# Patient Record
Sex: Female | Born: 1960 | Race: White | Hispanic: No | Smoking: Never smoker
Health system: Southern US, Community
[De-identification: ages and names within clinical notes are randomized; demographics above are authoritative.]

## PROBLEM LIST (undated history)

## (undated) DIAGNOSIS — R0602 Shortness of breath: Secondary | ICD-10-CM

## (undated) DIAGNOSIS — M7550 Bursitis of unspecified shoulder: Secondary | ICD-10-CM

## (undated) DIAGNOSIS — E785 Hyperlipidemia, unspecified: Secondary | ICD-10-CM

## (undated) DIAGNOSIS — F32A Depression, unspecified: Secondary | ICD-10-CM

## (undated) DIAGNOSIS — M069 Rheumatoid arthritis, unspecified: Secondary | ICD-10-CM

## (undated) DIAGNOSIS — R0601 Orthopnea: Secondary | ICD-10-CM

## (undated) DIAGNOSIS — G4700 Insomnia, unspecified: Secondary | ICD-10-CM

## (undated) DIAGNOSIS — R079 Chest pain, unspecified: Secondary | ICD-10-CM

## (undated) HISTORY — DX: Orthopnea: R06.01

## (undated) HISTORY — DX: Depression, unspecified: F32.A

## (undated) HISTORY — DX: Insomnia, unspecified: G47.00

## (undated) HISTORY — PX: OTHER SURGICAL HISTORY: SHX169

## (undated) HISTORY — DX: Hyperlipidemia, unspecified: E78.5

## (undated) HISTORY — PX: ABDOMINAL HYSTERECTOMY W/ PARTIAL VAGINACTOMY: SUR660

## (undated) HISTORY — DX: Rheumatoid arthritis, unspecified: M06.9

## (undated) HISTORY — DX: Shortness of breath: R06.02

## (undated) HISTORY — PX: ABDOMINAL HYSTERECTOMY: SHX81

## (undated) HISTORY — DX: Bursitis of unspecified shoulder: M75.50

## (undated) HISTORY — DX: Chest pain, unspecified: R07.9

## (undated) HISTORY — PX: ROTATOR CUFF REPAIR: SHX139

## (undated) HISTORY — PX: CHOLECYSTECTOMY: SHX55

---

## 1999-07-09 ENCOUNTER — Encounter (INDEPENDENT_AMBULATORY_CARE_PROVIDER_SITE_OTHER): Payer: Self-pay | Admitting: Specialist

## 1999-07-09 ENCOUNTER — Inpatient Hospital Stay (HOSPITAL_COMMUNITY): Admission: RE | Admit: 1999-07-09 | Discharge: 1999-07-11 | Payer: Self-pay | Admitting: Gynecology

## 2000-09-13 ENCOUNTER — Other Ambulatory Visit: Admission: RE | Admit: 2000-09-13 | Discharge: 2000-09-13 | Payer: Self-pay | Admitting: Gynecology

## 2000-12-14 ENCOUNTER — Ambulatory Visit (HOSPITAL_COMMUNITY): Admission: RE | Admit: 2000-12-14 | Discharge: 2000-12-14 | Payer: Self-pay | Admitting: *Deleted

## 2004-06-18 ENCOUNTER — Encounter: Admission: RE | Admit: 2004-06-18 | Discharge: 2004-06-18 | Payer: Self-pay | Admitting: Obstetrics and Gynecology

## 2004-12-02 ENCOUNTER — Encounter: Admission: RE | Admit: 2004-12-02 | Discharge: 2004-12-02 | Payer: Self-pay | Admitting: Obstetrics and Gynecology

## 2006-02-17 ENCOUNTER — Emergency Department (HOSPITAL_COMMUNITY): Admission: EM | Admit: 2006-02-17 | Discharge: 2006-02-17 | Payer: Self-pay | Admitting: Emergency Medicine

## 2006-08-20 ENCOUNTER — Encounter: Admission: RE | Admit: 2006-08-20 | Discharge: 2006-08-20 | Payer: Self-pay | Admitting: Allergy

## 2008-11-10 ENCOUNTER — Inpatient Hospital Stay (HOSPITAL_COMMUNITY): Admission: EM | Admit: 2008-11-10 | Discharge: 2008-11-11 | Payer: Self-pay | Admitting: Emergency Medicine

## 2008-11-30 ENCOUNTER — Encounter: Admission: RE | Admit: 2008-11-30 | Discharge: 2008-11-30 | Payer: Self-pay | Admitting: Family Medicine

## 2008-12-06 ENCOUNTER — Encounter: Admission: RE | Admit: 2008-12-06 | Discharge: 2008-12-06 | Payer: Self-pay | Admitting: Family Medicine

## 2010-05-15 LAB — BASIC METABOLIC PANEL
BUN: 13 mg/dL (ref 6–23)
BUN: 15 mg/dL (ref 6–23)
CO2: 27 mEq/L (ref 19–32)
CO2: 31 mEq/L (ref 19–32)
Calcium: 9 mg/dL (ref 8.4–10.5)
Calcium: 9 mg/dL (ref 8.4–10.5)
Chloride: 103 mEq/L (ref 96–112)
Chloride: 103 mEq/L (ref 96–112)
Creatinine, Ser: 0.7 mg/dL (ref 0.4–1.2)
Creatinine, Ser: 0.79 mg/dL (ref 0.4–1.2)
GFR calc Af Amer: >60 mL/min (ref >60)
GFR calc Af Amer: >60 mL/min (ref >60)
GFR calc non Af Amer: >60 mL/min (ref >60)
GFR calc non Af Amer: >60 mL/min (ref >60)
Glucose, Bld: 105 mg/dL — ABNORMAL HIGH (ref 70–99)
Glucose, Bld: 110 mg/dL — ABNORMAL HIGH (ref 70–99)
Potassium: 3.7 mEq/L (ref 3.5–5.1)
Potassium: 4 mEq/L (ref 3.5–5.1)
Sodium: 137 mEq/L (ref 135–145)
Sodium: 139 mEq/L (ref 135–145)

## 2010-05-15 LAB — CBC
HCT: 40.2 % (ref 36.0–46.0)
HCT: 40.5 % (ref 36.0–46.0)
Hemoglobin: 13.7 g/dL (ref 12.0–15.0)
Hemoglobin: 13.8 g/dL (ref 12.0–15.0)
MCHC: 34 g/dL (ref 30.0–36.0)
MCHC: 34 g/dL (ref 30.0–36.0)
MCV: 88.8 fL (ref 78.0–100.0)
MCV: 88.9 fL (ref 78.0–100.0)
Platelets: 284 10*3/uL (ref 150–400)
Platelets: 311 10*3/uL (ref 150–400)
RBC: 4.53 MIL/uL (ref 3.87–5.11)
RBC: 4.55 MIL/uL (ref 3.87–5.11)
RDW: 13.4 % (ref 11.5–15.5)
RDW: 13.4 % (ref 11.5–15.5)
WBC: 4.4 10*3/uL (ref 4.0–10.5)
WBC: 6.6 10*3/uL (ref 4.0–10.5)

## 2010-05-15 LAB — DIFFERENTIAL
Basophils Absolute: 0 10*3/uL (ref 0.0–0.1)
Basophils Relative: 1 % (ref 0–1)
Eosinophils Absolute: 0.1 10*3/uL (ref 0.0–0.7)
Eosinophils Relative: 2 % (ref 0–5)
Lymphocytes Relative: 32 % (ref 12–46)
Lymphs Abs: 2.1 10*3/uL (ref 0.7–4.0)
Monocytes Absolute: 0.5 10*3/uL (ref 0.1–1.0)
Monocytes Relative: 8 % (ref 3–12)
Neutro Abs: 3.8 10*3/uL (ref 1.7–7.7)
Neutrophils Relative %: 58 % (ref 43–77)

## 2010-05-15 LAB — CARDIAC PANEL(CRET KIN+CKTOT+MB+TROPI)
CK, MB: 1.6 ng/mL (ref 0.3–4.0)
CK, MB: 2.1 ng/mL (ref 0.3–4.0)
CK, MB: 2.2 ng/mL (ref 0.3–4.0)
Relative Index: 1.6 (ref 0.0–2.5)
Relative Index: 2.1 (ref 0.0–2.5)
Relative Index: INVALID (ref 0.0–2.5)
Total CK: 101 U/L (ref 7–177)
Total CK: 102 U/L (ref 7–177)
Total CK: 98 U/L (ref 7–177)
Troponin I: 0.01 ng/mL (ref 0.00–0.06)
Troponin I: 0.01 ng/mL (ref 0.00–0.06)
Troponin I: 0.01 ng/mL (ref 0.00–0.06)

## 2010-05-15 LAB — POCT CARDIAC MARKERS
CKMB, poc: 1.5 ng/mL (ref 1.0–8.0)
Myoglobin, poc: 69.2 ng/mL (ref 12–200)
Troponin i, poc: <0.05 ng/mL (ref 0.00–0.09)

## 2010-05-15 LAB — LIPID PANEL
Cholesterol: 324 mg/dL — ABNORMAL HIGH (ref 0–200)
HDL: 46 mg/dL (ref >39)
LDL Cholesterol: 207 mg/dL — ABNORMAL HIGH (ref 0–99)
Total CHOL/HDL Ratio: 7 RATIO
Triglycerides: 356 mg/dL — ABNORMAL HIGH (ref <150)
VLDL: 71 mg/dL — ABNORMAL HIGH (ref 0–40)

## 2010-05-15 LAB — HEMOGLOBIN A1C
Hgb A1c MFr Bld: 5.5 % (ref 4.6–6.1)
Mean Plasma Glucose: 111 mg/dL

## 2010-05-15 LAB — TSH: TSH: 2.132 u[IU]/mL (ref 0.350–4.500)

## 2010-05-15 LAB — D-DIMER, QUANTITATIVE: D-Dimer, Quant: 1.87 ug/mL-FEU — ABNORMAL HIGH (ref 0.00–0.48)

## 2010-06-27 NOTE — Op Note (Signed)
[facility]  Patient:    Virginia Hayes, Virginia Hayes                  MRN: [id number] Proc. Date: 07/09/99 Adm. Date:  [id number] Attending:  Katrina Stack CC:         Gretta Cool, M.D. (office x 2)                           Operative Report  PREOPERATIVE DIAGNOSES: 1. Severe and incapacitating cyclic pelvic pain secondary to endometriosis. 2. Pelvic organ prolapse with stress urinary incontinence, cystocele,    rectocele and enterocele. 3. History of laparoscopy and laser vaporization of stage III endometriosis    in 1989.  POSTOPERATIVE DIAGNOSES: 1. Severe and incapacitating cyclic pelvic pain secondary to endometriosis. 2. Pelvic organ prolapse with stress urinary incontinence, cystocele,    rectocele and enterocele. 3. History of laparoscopy and laser vaporization of stage III endometriosis    in 1989.  PROCEDURE:  Total abdominal hysterectomy, lysis of extensive adhesions, ureterolysis right, paravaginal repair plus Burch procedure, posterior and enterocele repairs.  ANESTHESIA:  General  SURGEON:  Gretta Cool, M.D.  ASSISTANT:  Raynald Kemp, M.D.  DESCRIPTION OF PROCEDURE:  Under excellent general anesthesia endotracheal with the patient prepped and draped in Allen stirrups and 30 cc Foley in her bladder, a Pfannenstiel incision was made and extended through the fascia. Rectus muscle was then separated in the midline and the peritoneum entered. The abdominal cavity was then examined.  The upper abdomen was entirely normal.  The appendix was visualized and not removed.  The cecum was quite large and the large bowel redundant.  At this point examination of the pelvis revealed bilateral severe ovarian adhesions to the lateral pelvic wall over the ureters bilaterally.  The adhesions were exceedingly dense, right greater than left.  Note there was extensive bleeding from the time of the first skin incision throughout the procedure  with the patients history of having taken ibuprofen one day preoperatively against advise.  At this point, the round ligaments were sutured, ligated and then transected.  The anterior leaf of the broad ligament was then opened with excision of the endometriosis involving the anterior leaf of the broad ligament.  The infundibulopelvic vessels were then skeletonized on each side and then clamped, cut, sutured, and transected. The pedicles were doubly ligated with 0 Vicryl.  On the right, dense adhesion of the ovary was encountered.  Ureterolysis was undertaken so as to mobilize the ureter and prevent injury.  Endometriosis with a portion of the ovary adherent to it was then excised along with the peritoneum involved by the endometriosis.  The uterine vessels were then skeletonized on each side, clamped, cut, sutured, and tied with 0 Vicryl.  A branch of the right uterine vessel was transected and retracted toward the pelvic wall.  The vessel was identified and individually ligated.  Cardinal and uterosacral ligaments were then progressively clamped, cut, sutured, and tied with 0 Vicryl.  The cervix was then excised and the vaginal closed with a running suture of 0 Vicryl. The uterosacral were then identified as far posteriorly as possible secured to the vaginal cuff both anteriorly and posteriorly with interrupted suture of 0 Ethibond so as to provide level 1 support using the uterosacral cardinal complex.  The cardinal and uterosacral ligaments were then also plicated to the vaginal cuff with interrupted sutures of 0 Ethibond.  At this point,  all the pedicles were dry.  The pelvis is irrigated with lactated Ringers to remove debris.  Approximately 1000 cc of blood loss was encountered with the management of the branch of the uterine vessel.  The pelvic floor was then reperitonealized with a running suture of 2-0 Monocryl.  At this point, the packs were removed and the sponge and lap counts  were correct.  The abdominal peritoneum was closed with a running suture of 0 Monocryl.  At this point, attention was turned to the paravaginal repair and Burch.  The retropubic space was dissected and the fatty tissue removed from the endopelvic fascia.  The white line fascia of the pelvis was then identified and the landmarks identified for the paravaginal repair.  A series of approximately six sutures on the right and five on the left were placed so as to repair the paravaginal defect.  The Burch procedure was then also used to secure the vesicle neck into high intra-abdominal position.  Sutures were placed as far lateral as possible and secured to Coopers ligament.  Three sutures were required on the right and two on the left using 0 Ethibond.  At this point, all of the retropubic space was dry.  The space was irrigated with lactated Ringers. The rectus muscles were then plicated in the midline with a running suture of 0 Monocryl.  The fascia was then approximated from each angle to the midline with a running suture of 0 Vicryl.  Subcutaneous tissues approximated with interrupted sutures of 3-0 Vicryl and the skin closed with skin staples and Steri-Strips.  At this point, the patient was repositioned for the vaginal portion of the procedure.  The posterior and enterocele repair was begun by incision of the posterior vaginal mucosa from the introitus to the apex of the vaginal cuff.  The cut edges were then grasped with Allis clamps.  The mucosa was then dissected from the underlying perirectal fascia.  The uterosacral ligaments were then, once again, plicated as far posteriorly as possible so as to further secure the level 1 support.  Sutures of 0 Vicryl were then used to plicate the transverse fascial separation from the apex of the vaginal cuff to the perirectal fascia. The enterocele was thus entirely plicated to create a complete envelope of endopelvic fascia.  The perirectal  fascia was then further plicated in the midline from the apex to the vaginal cuff to the introitus.  The mucosa was then trimmed and the upper layers of mucosa and upper layers of endopelvic  fascia were then approximated with a running suture of 2-0 Vicryl. The perineal body musculature was then repaired with interrupted sutures of 2-0 Vicryl.  The operators fingers were placed in the rectum were placed in the rectum for suture placement to prevent encroachment on the rectal lumen. A Bonnano suprapubic cystocath was then placed after approximately 400 cc distension of the bladder.  The Bonnano suprapubic catheter was secured with interrupted sutures of 0 Ethibond.  At the end of the procedure, sponge and lap counts were correct.  There were no complications.  The patient returned to the recovery room in excellent condition. DD:  07/09/99 TD:  07/10/99 Job: [id number]

## 2010-06-27 NOTE — H&P (Signed)
[facility]  Patient:    Virginia Hayes, Virginia Hayes                  MRN: [id number] Adm. Date:  [id number] Attending:  Katrina Stack                         History and Physical  CHIEF COMPLAINT: 1. Endometriosis with increasingly severe and incapacitating cyclic pelvic    pain. 2. Urinary incontinence.  HISTORY OF PRESENT ILLNESS:  A 50 year old white married gravida 3, para 3 with known severe grade 3 endometriosis involving both ovaries treated initially in 1989 with successful fertility subsequently. Initially treated by laparoscopy, laser vaporization and excision of endometriosis and uterosacral nerve oblation. She had 2 vaginal deliveries without complication. Then with her third delivery by nurse midwives had a posterior presentation with prolonged pushing and no episiotomy. She subsequently developed significant urinary incontinence with coughing, sneezing, laughing, straining, and lifting that has increased since that time. She also has developed dyspareunia that was not present previously. Her symptoms have now progressed to the point of incapacitation for several days during her cycle in the premenstrual period. She requires narcotic medication for control of her pain. She also has had progressively worsening incontinence. She has had bladder evaluation with voiding diary and has voided volumes as high as 250 cc. She has incontinence demonstrated in supine position with her bladder full corrected by elevation of the vesical neck. She also has positive Q tip test with rotation of approximately 40 degrees. She has a right paravaginal defect greater than left. No evidence of central fascial weakness or defect. She has a quite large enterocele with transverse detachment of the perirectal fascia and levator diastasis. 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 She is now admitted for hysterectomy and possible  bilateral salpingo-oophorectomy and for paravaginal plus Burch posterior and enterocele repairs.  I have discussed in detail the risks and benefits of the procedure, possible need for bilateral salpingo-oophorectomy considering that she previously had involvement of both ovaries with endometrioma formation. She understands the risks of bowel, bladder, adjacent organ injury, hemorrhage, thrombosis, infection, and possible need for long-term hormone replacement therapy of both estrogen and androgen. She also understands the need for avoidance of straining in correction of her chronic constipation to prevent recurrence of pelvic support difficulties.  PAST MEDICAL HISTORY:  Usual childhood diseases without sequelae.  MEDICAL ILLNESSES:  Endometriosis as above. History of irritable bowel syndrome with constipation alternating with diarrhea. The patient also has hypercholesterolemia with elevated LDL and slight increasing calculated coronary risk.  PAST SURGICAL HISTORY:  Laparoscopy, laser vaporization of endometriosis, excision of laser uterosacral nerve oblation 1989. Knee surgery in 1994.  OTHER HOSPITALIZATIONS:  Vaginal delivery x 3.  FAMILY HISTORY:  Father died at age 53 of massive MI. Mother had enormous endometriomas with intraperitoneal rupture. Also had pelvic support problems requiring surgery.  SOCIAL HISTORY:  Married with 3 children living at home. She is a Engineer, civil (consulting) at [employer] and plans to return for PA program to the [place] [employer] [place].  REVIEW OF SYSTEMS:  HEENT:  Denies symptoms.  CARDIAC/RESPIRATORY:  Denies asthma, cough, bronchitis, shortness of breath. GI/GU:  Denies frequency, urgency, dysuria, change in bowel habits other than known long-term IBS.  PHYSICAL EXAMINATION:  GENERAL:  Well-developed, tall, white female.  HEENT:  PERRLA. Fundi benign. Oropharynx clear.  NECK:  Supple without masses or thyroid enlargement.  CHEST:  Asymmetry of  the chest wall with slight pigeon breast deformity of the sternum. Chest is clear P to A.  BREASTS:  Soft without mass, nodes, or nipple discharge.  HEART:  Regular rhythm without murmur or cardiac enlargement.  ABDOMEN:  Soft, scaphoid without mass or organomegaly.  PELVIC:  External genitalia normal female. Vagina clean rugae. The introitus is somewhat gaping. There is a right paravaginal defect with straining. No evidence of central or transverse fascial defect at the anterior vaginal wall. She has rotational descent of the vesical neck as above. There is extremely poor posterior pelvic support with a grade 3 enterocele, levator diastasis and detachment of the perineal body with perineal descent syndrome with straining. She also has grade 2 uterine descensus. The cervix is large and parous. The uterus is also enlarged to approximately 6 weeks size, adnexa tender to palpation and somewhat fixed. Rectovaginal exam confirms.  EXTREMITIES:  Negative.  NEUROLOGIC:  Physiologic.  IMPRESSION: 1. Endometriosis with incapacitating cyclic pelvic pain and previous history    of laser vaporization of stage 3 endometriosis. 2. Stress urinary incontinence increasingly severe. 3. Posterior pelvic support problems with enterocele, rectocele. 4. Hypercholesterolemia with strong family history of early coronary death in    her father. DD:  1999/08/02 TD:  1999-08-02 Job: [id number]

## 2010-08-22 ENCOUNTER — Other Ambulatory Visit: Payer: Self-pay

## 2010-08-22 ENCOUNTER — Other Ambulatory Visit (HOSPITAL_COMMUNITY): Payer: Self-pay | Admitting: Family Medicine

## 2010-08-22 ENCOUNTER — Inpatient Hospital Stay (HOSPITAL_COMMUNITY): Admission: RE | Admit: 2010-08-22 | Payer: Self-pay | Source: Ambulatory Visit

## 2010-08-22 ENCOUNTER — Other Ambulatory Visit: Payer: Self-pay | Admitting: Family Medicine

## 2010-08-22 DIAGNOSIS — H543 Unqualified visual loss, both eyes: Secondary | ICD-10-CM

## 2010-08-22 DIAGNOSIS — R52 Pain, unspecified: Secondary | ICD-10-CM

## 2010-08-22 DIAGNOSIS — R609 Edema, unspecified: Secondary | ICD-10-CM

## 2010-08-22 DIAGNOSIS — R42 Dizziness and giddiness: Secondary | ICD-10-CM

## 2010-09-05 ENCOUNTER — Other Ambulatory Visit: Payer: Self-pay | Admitting: Family Medicine

## 2010-09-05 ENCOUNTER — Ambulatory Visit
Admission: RE | Admit: 2010-09-05 | Discharge: 2010-09-05 | Disposition: A | Discharge status: Home / Self Care | Payer: 59 | Source: Ambulatory Visit | Attending: Family Medicine | Admitting: Family Medicine

## 2010-09-05 DIAGNOSIS — M79669 Pain in unspecified lower leg: Secondary | ICD-10-CM

## 2011-12-06 ENCOUNTER — Emergency Department (HOSPITAL_COMMUNITY)
Admission: EM | Admit: 2011-12-06 | Discharge: 2011-12-06 | Disposition: A | Discharge status: Home / Self Care | Payer: 59 | Attending: Emergency Medicine | Admitting: Emergency Medicine

## 2011-12-06 ENCOUNTER — Emergency Department (HOSPITAL_COMMUNITY): Payer: 59

## 2011-12-06 ENCOUNTER — Encounter (HOSPITAL_COMMUNITY): Payer: Self-pay | Admitting: Physical Medicine and Rehabilitation

## 2011-12-06 DIAGNOSIS — S0100XA Unspecified open wound of scalp, initial encounter: Secondary | ICD-10-CM | POA: Insufficient documentation

## 2011-12-06 DIAGNOSIS — Z79899 Other long term (current) drug therapy: Secondary | ICD-10-CM | POA: Insufficient documentation

## 2011-12-06 DIAGNOSIS — Z8739 Personal history of other diseases of the musculoskeletal system and connective tissue: Secondary | ICD-10-CM | POA: Insufficient documentation

## 2011-12-06 DIAGNOSIS — Y929 Unspecified place or not applicable: Secondary | ICD-10-CM | POA: Insufficient documentation

## 2011-12-06 DIAGNOSIS — S60222A Contusion of left hand, initial encounter: Secondary | ICD-10-CM

## 2011-12-06 DIAGNOSIS — S0101XA Laceration without foreign body of scalp, initial encounter: Secondary | ICD-10-CM

## 2011-12-06 DIAGNOSIS — S60229A Contusion of unspecified hand, initial encounter: Secondary | ICD-10-CM | POA: Insufficient documentation

## 2011-12-06 DIAGNOSIS — S301XXA Contusion of abdominal wall, initial encounter: Secondary | ICD-10-CM | POA: Insufficient documentation

## 2011-12-06 DIAGNOSIS — Y9352 Activity, horseback riding: Secondary | ICD-10-CM | POA: Insufficient documentation

## 2011-12-06 DIAGNOSIS — W19XXXA Unspecified fall, initial encounter: Secondary | ICD-10-CM

## 2011-12-06 DIAGNOSIS — Z8709 Personal history of other diseases of the respiratory system: Secondary | ICD-10-CM | POA: Insufficient documentation

## 2011-12-06 DIAGNOSIS — E785 Hyperlipidemia, unspecified: Secondary | ICD-10-CM | POA: Insufficient documentation

## 2011-12-06 LAB — CBC WITH DIFFERENTIAL/PLATELET
Basophils Relative: 0 % (ref 0–1)
Eosinophils Absolute: 0.2 10*3/uL (ref 0.0–0.7)
HCT: 39.5 % (ref 36.0–46.0)
Hemoglobin: 13.6 g/dL (ref 12.0–15.0)
Lymphs Abs: 2.2 10*3/uL (ref 0.7–4.0)
MCH: 29.5 pg (ref 26.0–34.0)
MCHC: 34.4 g/dL (ref 30.0–36.0)
MCV: 85.7 fL (ref 78.0–100.0)
Monocytes Absolute: 0.5 10*3/uL (ref 0.1–1.0)
Monocytes Relative: 5 % (ref 3–12)
Neutrophils Relative %: 71 % (ref 43–77)
RBC: 4.61 MIL/uL (ref 3.87–5.11)

## 2011-12-06 LAB — POCT I-STAT, CHEM 8
BUN: 14 mg/dL (ref 6–23)
Calcium, Ion: 1.23 mmol/L (ref 1.12–1.23)
Chloride: 106 mEq/L (ref 96–112)
Creatinine, Ser: 1 mg/dL (ref 0.50–1.10)

## 2011-12-06 LAB — SAMPLE TO BLOOD BANK

## 2011-12-06 MED ORDER — IOHEXOL 300 MG/ML  SOLN
100.0000 mL | Freq: Once | INTRAMUSCULAR | Status: AC | PRN
  Administered 2011-12-06: 100 mL via INTRAVENOUS

## 2011-12-06 MED ORDER — ONDANSETRON HCL 4 MG/2ML IJ SOLN: 4.0000 mg | Freq: Once | INTRAMUSCULAR | Status: DC

## 2011-12-06 MED ORDER — MORPHINE SULFATE 4 MG/ML IJ SOLN: 4.0000 mg | Freq: Once | INTRAMUSCULAR | Status: DC

## 2011-12-06 MED ORDER — HYDROCODONE-ACETAMINOPHEN 5-325 MG PO TABS: 2.0000 | ORAL_TABLET | ORAL | Status: DC | PRN

## 2011-12-06 MED ORDER — LIDOCAINE HCL 2 % IJ SOLN
10.0000 mL | Freq: Once | INTRAMUSCULAR | Status: AC
  Administered 2011-12-06: 200 mg
  Filled 2011-12-06: qty 20

## 2011-12-06 NOTE — ED Provider Notes (Signed)
History     CSN: [id number]  Arrival date & time 12/06/11  1739   First MD Initiated Contact with Patient 12/06/11 1742      Chief Complaint  Patient presents with  . Fall    (Consider location/radiation/quality/duration/timing/severity/associated sxs/prior treatment) Patient is a 51 y.o. female presenting with fall. The history is provided by the patient. No language interpreter was used.  Fall The accident occurred 1 to 2 hours ago. Incident: Pt was riding a horse.  The horse was spooked by dogs and threw her.  The horse stepped on the back of her head and then on her left hand.  Her son's girlfriend took the horse's reins and led the horse away.  Pt was ambulatory to get on a 4 wheeler.  She fell from a height of 3 to 5 ft. She landed on dirt. The volume of blood lost was minimal. The point of impact was the head (Left hand). The pain is present in the head (Left hand). The pain is at a severity of 9/10. The pain is moderate. She was ambulatory at the scene. There was entrapment (Her left hand was stepped on by the horse, which then just stood there, holding pt down.) after the fall. There was no drug use involved in the accident. There was no alcohol use involved in the accident. Pertinent negatives include no loss of consciousness. Exacerbated by: Nothing. Treatment on scene includes a c-collar and a backboard. She has tried immobilization for the symptoms. The treatment provided no (She is up to date on tetanus immunization.) relief.    Past Medical History  Diagnosis Date  . Chest pain   . Dyslipidemia   . Chest pain   . SOB (shortness of breath)   . Orthopnea   . Hyperlipidemia   . Shoulder bursitis     Past Surgical History  Procedure Date  . Abdominal hysterectomy w/ partial vaginactomy   . Left knee      Family History  Problem Relation Age of Onset  . Heart disease Father     History  Substance Use Topics  . Smoking status: Never Smoker   . Smokeless tobacco:  Not on file  . Alcohol Use: No    OB History    Grav Para Term Preterm Abortions TAB SAB Ect Mult Living                  Review of Systems  Constitutional: Negative.   HENT:       Laceration of right occipital scalp   Eyes: Negative.   Respiratory: Negative.   Cardiovascular: Negative.   Gastrointestinal: Negative.   Genitourinary: Negative.   Musculoskeletal:       Horse stepped on left hand.  Neurological: Negative.  Negative for loss of consciousness.  Psychiatric/Behavioral: Negative.     Allergies  Review of patient's allergies indicates no known allergies.  Home Medications   Current Outpatient Rx  Name Route Sig Dispense Refill  . ATORVASTATIN CALCIUM 20 MG PO TABS Oral Take 20 mg by mouth daily.    Marland Kitchen DIPHENHYDRAMINE-APAP (SLEEP) 25-500 MG PO TABS Oral Take 1 tablet by mouth at bedtime as needed.    Marland Kitchen ESTRADIOL-NORETHINDRONE ACET 0.05-0.14 MG/DAY TD PTTW Transdermal Place 1 patch onto the skin 2 (two) times a week.    . ALLEGRA-D 24 HOUR PO Oral Take by mouth.      BP 144/96  Pulse 103  Temp 98.1 F (36.7 C) (Oral)  Resp 18  SpO2 100%  Physical Exam  Nursing note and vitals reviewed. Constitutional: She appears well-developed and well-nourished.       In mild-moderate distress with pain in occipital region, and in left hand.  HENT:       2 cm laceration on right occipital scalp.  No bony deformity of the skull.  No FB in wound.  Eyes: Conjunctivae normal and EOM are normal. Pupils are equal, round, and reactive to light.  Neck: Normal range of motion. Neck supple.       Cervical collar removed by me.  Cardiovascular: Normal rate and regular rhythm.   Pulmonary/Chest: Effort normal and breath sounds normal.  Abdominal: Soft. Bowel sounds are normal.  Musculoskeletal:       She has contusion and swelling on the dorsum of the left hand overlying the proximal second and third metacarpals.  She has intact sensation and tendon function in the left hand.   The skin is intact.  Skin: Skin is warm and dry.  Psychiatric: She has a normal mood and affect. Her behavior is normal.    ED Course  LACERATION REPAIR Date/Time: 12/06/2011 6:16 PM Performed by: Osvaldo Human Authorized by: Osvaldo Human Consent: Verbal consent obtained. Written consent not obtained. Risks and benefits: risks, benefits and alternatives were discussed Consent given by: patient Patient understanding: patient states understanding of the procedure being performed Patient consent: the patient's understanding of the procedure matches consent given Site marked: the operative site was not marked Patient identity confirmed: verbally with patient Time out: Immediately prior to procedure a "time out" was called to verify the correct patient, procedure, equipment, support staff and site/side marked as required. Body area: head/neck Location details: scalp Laceration length: 3 cm Foreign bodies: no foreign bodies Tendon involvement: none Nerve involvement: none Vascular damage: no Anesthesia: local infiltration Local anesthetic: lidocaine 2% without epinephrine Patient sedated: no Preparation: Patient was prepped and draped in the usual sterile fashion. Irrigation solution: saline Irrigation method: tap Amount of cleaning: standard Debridement: none Degree of undermining: none Skin closure: staples Number of sutures: 9 Technique: simple Approximation: loose Approximation difficulty: simple Dressing: Wound left exposed. Patient tolerance: Patient tolerated the procedure well with no immediate complications.   (including critical care time)   7:50 PM CT of head and neck negative.  X-ray of left hand negative.  She is complaining of RUQ pain now.  Will order lab tests, CT of abdomen/pelvis with IV contrast.  9:23 PM CT abdomen/pelvis showed contusion of subq tissue in RUQ, no visceral injury.  Released. Rx hydrocodone-acetaminophen q4h prn pain.  Staples  out in 7-10 days.  1. Fall   2. Laceration of scalp   3. Contusion of left hand   4. Contusion of abdominal wall         Carleene Cooper III, MD 12/06/11 2127

## 2011-12-06 NOTE — ED Notes (Signed)
Pt transported to CT scan and x-ray. Family remains in exam room.

## 2011-12-06 NOTE — ED Notes (Addendum)
Pt transported to CT. Pt stating she does not want the pain medication at this time.

## 2011-12-06 NOTE — ED Notes (Signed)
Dr. Ignacia Palma at the bedside to perform suture care to scalp laceration. Pt tolerating without difficulty.

## 2011-12-06 NOTE — ED Notes (Signed)
Pt presents to department for evaluation of fall off horse. States she was bucked off horse while riding. Horse kicked her in head and stepped on L hand. Upon arrival 2cm laceration noted to R occipital scalp, bleeding controlled. Bruising/swelling noted to L hand. Pt can wiggle digits, but is unable to make complete fist. 3/10 pain at the time. She is conscious alert and oriented x4. c-collar and LSB removed by Dr. Ignacia Palma. Pt is conscious alert and oriented x4. Able to move all extremities. She is up to date on tetanus.

## 2011-12-06 NOTE — ED Notes (Signed)
Pt presents to department from [facility] for evaluation of fall from horse. States she was bucked off horse, kicked in head and trampeled on L hand. Laceration noted to R occipital scalp, bleeding controlled. Pt is conscious alert and oriented x4. CMS intact. LSB and c-collar in place per EMS.

## 2011-12-06 NOTE — ED Notes (Signed)
Pt returned from CT °

## 2012-03-28 ENCOUNTER — Other Ambulatory Visit: Payer: Self-pay | Admitting: Specialist

## 2012-03-28 DIAGNOSIS — N63 Unspecified lump in unspecified breast: Secondary | ICD-10-CM

## 2012-04-05 ENCOUNTER — Other Ambulatory Visit: Payer: Self-pay | Admitting: Specialist

## 2012-04-05 DIAGNOSIS — N644 Mastodynia: Secondary | ICD-10-CM

## 2012-04-06 ENCOUNTER — Other Ambulatory Visit: Payer: Self-pay | Admitting: Specialist

## 2012-04-06 ENCOUNTER — Ambulatory Visit
Admission: RE | Admit: 2012-04-06 | Discharge: 2012-04-06 | Disposition: A | Discharge status: Home / Self Care | Payer: 59 | Source: Ambulatory Visit | Attending: Specialist | Admitting: Specialist

## 2012-04-06 DIAGNOSIS — N63 Unspecified lump in unspecified breast: Secondary | ICD-10-CM

## 2012-04-14 ENCOUNTER — Encounter: Payer: Self-pay | Admitting: *Deleted

## 2012-05-31 ENCOUNTER — Other Ambulatory Visit: Payer: Self-pay | Admitting: *Deleted

## 2012-06-02 ENCOUNTER — Other Ambulatory Visit: Payer: Self-pay | Admitting: *Deleted

## 2012-06-02 DIAGNOSIS — M2550 Pain in unspecified joint: Secondary | ICD-10-CM

## 2013-04-24 ENCOUNTER — Other Ambulatory Visit: Payer: Self-pay

## 2013-04-24 DIAGNOSIS — Z1231 Encounter for screening mammogram for malignant neoplasm of breast: Secondary | ICD-10-CM

## 2013-05-08 ENCOUNTER — Inpatient Hospital Stay: Admission: RE | Admit: 2013-05-08 | Payer: 59 | Source: Ambulatory Visit

## 2016-03-02 ENCOUNTER — Emergency Department (HOSPITAL_BASED_OUTPATIENT_CLINIC_OR_DEPARTMENT_OTHER)
Admission: EM | Admit: 2016-03-02 | Discharge: 2016-03-03 | Disposition: A | Discharge status: Home / Self Care | Payer: BLUE CROSS/BLUE SHIELD | Attending: Emergency Medicine | Admitting: Emergency Medicine

## 2016-03-02 ENCOUNTER — Emergency Department (HOSPITAL_BASED_OUTPATIENT_CLINIC_OR_DEPARTMENT_OTHER): Payer: BLUE CROSS/BLUE SHIELD

## 2016-03-02 ENCOUNTER — Encounter (HOSPITAL_BASED_OUTPATIENT_CLINIC_OR_DEPARTMENT_OTHER): Payer: Self-pay | Admitting: *Deleted

## 2016-03-02 DIAGNOSIS — Y999 Unspecified external cause status: Secondary | ICD-10-CM | POA: Diagnosis not present

## 2016-03-02 DIAGNOSIS — S8011XA Contusion of right lower leg, initial encounter: Secondary | ICD-10-CM

## 2016-03-02 DIAGNOSIS — Y939 Activity, unspecified: Secondary | ICD-10-CM | POA: Diagnosis not present

## 2016-03-02 DIAGNOSIS — S8991XA Unspecified injury of right lower leg, initial encounter: Secondary | ICD-10-CM | POA: Diagnosis present

## 2016-03-02 DIAGNOSIS — W208XXA Other cause of strike by thrown, projected or falling object, initial encounter: Secondary | ICD-10-CM | POA: Insufficient documentation

## 2016-03-02 DIAGNOSIS — Y929 Unspecified place or not applicable: Secondary | ICD-10-CM | POA: Diagnosis not present

## 2016-03-02 MED ORDER — HYDROCODONE-ACETAMINOPHEN 5-325 MG PO TABS
2.0000 | ORAL_TABLET | Freq: Once | ORAL | Status: AC
  Administered 2016-03-02: 2 via ORAL
  Filled 2016-03-02: qty 2

## 2016-03-02 MED ORDER — IBUPROFEN 800 MG PO TABS
800.0000 mg | ORAL_TABLET | Freq: Once | ORAL | Status: AC
  Administered 2016-03-02: 800 mg via ORAL
  Filled 2016-03-02: qty 1

## 2016-03-02 NOTE — ED Provider Notes (Signed)
By signing my name below, I, [name], attest that this documentation has been prepared under the direction and in the presence of Sarrinah Gardin N Merrissa Giacobbe, DO. Electronically signed, [name], ED Scribe. 03/02/16. 11:53 PM.  TIME SEEN: 11:20 PM  CHIEF COMPLAINT: RLE injury.  HPI:  HPI Comments: Virginia Hayes is a 56 y.o. female who presents to the Emergency Department s/p an injury to her right lower extremity that occurred at approximately 1700 this evening. Pt was cleaning a carpet cleaner when she was flushing it with fluid and states that the cleaner became much heavier which caused her to drop it on her right lower extremity. She has noted bruising to right lower extremity to medial aspect of her calf. Pt also has significant swelling in the area but pt states that it has improved since arriving to the ED. Shortly after, her toes started to have a tingling sensation. Pt reports a tightness to her medial right calf when extending her right foot. She frequently takes Advil for her left knee pain and has taken this tonight with no relief of pain. She is followed by Dr. Jerl Santosalldorf with [facility] for previous left knee pain.   ROS: See HPI Constitutional: no fever  Eyes: no drainage  ENT: no runny nose   Cardiovascular:  no chest pain  Resp: no SOB  GI: no vomiting GU: no dysuria Integumentary: no rash  Allergy: no hives  Musculoskeletal: no leg swelling  Neurological: no slurred speech ROS otherwise negative  PAST MEDICAL HISTORY/PAST SURGICAL HISTORY:  Past Medical History:  Diagnosis Date  . Chest pain   . Chest pain   . Dyslipidemia   . Hyperlipidemia   . Orthopnea   . Shoulder bursitis   . SOB (shortness of breath)     MEDICATIONS:  Prior to Admission medications   Medication Sig Start Date End Date Taking? Authorizing Provider  diphenhydrAMINE (BENADRYL) 25 mg capsule Take 25 mg by mouth at bedtime as needed.    Historical Provider, MD  HYDROcodone-acetaminophen  (NORCO/VICODIN) 5-325 MG per tablet Take 2 tablets by mouth every 4 (four) hours as needed for pain. 12/06/11   Carleene CooperAlan Davidson, MD  rosuvastatin (CRESTOR) 5 MG tablet Take 5 mg by mouth daily.    Historical Provider, MD  thyroid (ARMOUR) 120 MG tablet Take 120 mg by mouth daily.    Historical Provider, MD  zolpidem (AMBIEN) 10 MG tablet Take 10 mg by mouth at bedtime.    Historical Provider, MD    ALLERGIES:  No Known Allergies  SOCIAL HISTORY:  Social History  Substance Use Topics  . Smoking status: Never Smoker  . Smokeless tobacco: Not on file  . Alcohol use No    FAMILY HISTORY: Family History  Problem Relation Age of Onset  . Heart disease Father     EXAM: BP (!) 178/112 (BP Location: Left Arm)   Pulse 116   Temp 98.6 F (37 C) (Oral)   Resp 18   Ht 5\' 10"  (1.778 m)   Wt 195 lb (88.5 kg)   SpO2 96%   BMI 27.98 kg/m  CONSTITUTIONAL: Alert and oriented and responds appropriately to questions. Well-appearing; well-nourished; GCS 15 HEAD: Normocephalic; atraumatic EYES: Conjunctivae clear, PERRL, EOMI ENT: normal nose; no rhinorrhea; moist mucous membranes; pharynx without lesions noted; no dental injury; no septal hematoma NECK: Supple, no meningismus, no LAD; no midline spinal tenderness, step-off or deformity; trachea midline CARD: Regular and tachycardic; S1 and S2 appreciated; no murmurs, no clicks,  no rubs, no gallops RESP: Normal chest excursion without splinting or tachypnea; breath sounds clear and equal bilaterally; no wheezes, no rhonchi, no rales; no hypoxia or respiratory distress CHEST:  chest wall stable, no crepitus or ecchymosis or deformity, nontender to palpation; no flail chest ABD/GI: Normal bowel sounds; non-distended; soft, non-tender, no rebound, no guarding; no ecchymosis or other lesions noted PELVIS:  stable, nontender to palpation BACK:  The back appears normal and is non-tender to palpation, there is no CVA tenderness; no midline spinal  tenderness, step-off or deformity EXT:  Ecchymosis and contusion to right medial calf with associated swelling, tenderness, no bony deformity, 2+ dp pulse. Tingling in all 5 toes of the right foot. Otherwise normal ROM in all joints; otherwise extremitites non-tender to palpation; no edema; normal capillary refill; no cyanosis, no bony tenderness or bony deformity of patient's extremities, no joint effusion, compartments are soft, extremities are warm and well-perfused, or lacerations.  SKIN: Normal color for age and race; warm NEURO: Moves all extremities equally, sensation to light touch intact diffusely, cranial nerves II through XII intact PSYCH: The patient's mood and manner are appropriate. Grooming and personal hygiene are appropriate.  MEDICAL DECISION MAKING: Patient here with contusion to the right leg. No fracture or dislocation noted. Compartments are soft but she does report tingling in these toes and pain with flexion and extension of the foot. We will elevate her foot, apply ice and provide with pain medication and reassess.  ED PROGRESS: Patient's numbness has completely resolved and her pain has improved as well with her foot being elevated. Vital signs have also improved somewhat. Compartments are still soft. I do not feel she needs emergent orthopedic workup and have recommended she monitor this closely for any signs of compartment syndrome. 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 Have recommended alternating Tylenol and Motrin for pain. Have offered her stronger pain medication from home which she has declined. Recommended rest, elevation and ice. Offered work note which she also declines. Offered crutches which she is declines. No other injury on exam. Still has strong pulses with a warm and well-perfused foot and now normal sensation throughout the foot. Able to move her toes, ankle without difficulty or significant pain.   At this time, I do not feel there is any  life-threatening condition present. I have reviewed and discussed all results (EKG, imaging, lab, urine as appropriate) and exam findings with patient/family. I have reviewed nursing notes and appropriate previous records.  I feel the patient is safe to be discharged home without further emergent workup and can continue workup as an outpatient as needed. Discussed usual and customary return precautions. Patient/family verbalize understanding and are comfortable with this plan.  Outpatient follow-up has been provided. All questions have been answered.  I personally performed the services described in this documentation, which was scribed in my presence. The recorded information has been reviewed and is accurate.     Layla Maw Demba Nigh, DO 03/03/16 [phone]

## 2016-03-02 NOTE — ED Triage Notes (Signed)
Pt c/o right leg injury x 1 day

## 2016-03-03 NOTE — Discharge Instructions (Signed)
You may alternate between Tylenol 1000 mg every 6 hours as needed for pain and ibuprofen 800 mg every 8 hours as needed for pain. °

## 2016-03-03 NOTE — ED Notes (Signed)
Pt states her toes feel a little better, and she can move them more

## 2016-03-03 NOTE — ED Notes (Signed)
Pt verbalizes understanding of d/c instructions and denies any further needs at this time. 

## 2016-07-27 ENCOUNTER — Other Ambulatory Visit: Payer: Self-pay | Admitting: Family Medicine

## 2016-07-27 DIAGNOSIS — Z1231 Encounter for screening mammogram for malignant neoplasm of breast: Secondary | ICD-10-CM

## 2016-08-04 ENCOUNTER — Ambulatory Visit
Admission: RE | Admit: 2016-08-04 | Discharge: 2016-08-04 | Disposition: A | Discharge status: Home / Self Care | Payer: BLUE CROSS/BLUE SHIELD | Source: Ambulatory Visit | Attending: Family Medicine | Admitting: Family Medicine

## 2016-08-04 DIAGNOSIS — Z1231 Encounter for screening mammogram for malignant neoplasm of breast: Secondary | ICD-10-CM

## 2016-11-17 ENCOUNTER — Ambulatory Visit
Admission: RE | Admit: 2016-11-17 | Discharge: 2016-11-17 | Disposition: A | Discharge status: Home / Self Care | Payer: BLUE CROSS/BLUE SHIELD | Source: Ambulatory Visit | Attending: Cardiology | Admitting: Cardiology

## 2016-11-17 ENCOUNTER — Other Ambulatory Visit: Payer: Self-pay | Admitting: Cardiology

## 2016-11-17 DIAGNOSIS — R0602 Shortness of breath: Secondary | ICD-10-CM

## 2017-11-18 ENCOUNTER — Ambulatory Visit: Payer: Self-pay | Admitting: Psychiatry

## 2017-12-26 ENCOUNTER — Encounter: Payer: Self-pay | Admitting: Emergency Medicine

## 2017-12-26 DIAGNOSIS — F4010 Social phobia, unspecified: Secondary | ICD-10-CM | POA: Insufficient documentation

## 2017-12-26 DIAGNOSIS — F988 Other specified behavioral and emotional disorders with onset usually occurring in childhood and adolescence: Secondary | ICD-10-CM

## 2017-12-26 DIAGNOSIS — F329 Major depressive disorder, single episode, unspecified: Secondary | ICD-10-CM | POA: Insufficient documentation

## 2018-01-11 ENCOUNTER — Other Ambulatory Visit: Payer: Self-pay

## 2018-01-11 ENCOUNTER — Ambulatory Visit: Payer: Self-pay | Admitting: Psychiatry

## 2018-01-11 MED ORDER — ZOLPIDEM TARTRATE 10 MG PO TABS: 10.0000 mg | ORAL_TABLET | Freq: Every day | ORAL | 1 refills | Status: DC

## 2018-02-07 ENCOUNTER — Telehealth: Payer: Self-pay | Admitting: Psychiatry

## 2018-02-07 NOTE — Telephone Encounter (Signed)
Pt called and reported that she is flying to [place] tomorrow morning at 5 am for a family wedding and tried to fill her Ambien script this evening and was told that she cannot fill until 02/09/19. Pt requesting approval to fill Ambien early (today). Contacted pharmacy to authorize early fill. Pharmacy reports that Ambien was filled on 01/11/18 and picked up on 01/13/18. Pharmacy reports that pt said she had one Ambien tablet remaining. Early fill authorized.

## 2018-02-08 ENCOUNTER — Telehealth: Payer: Self-pay | Admitting: Psychiatry

## 2018-02-08 ENCOUNTER — Other Ambulatory Visit: Payer: Self-pay | Admitting: Psychiatry

## 2018-02-08 MED ORDER — ZOLPIDEM TARTRATE 10 MG PO TABS: 10.0000 mg | ORAL_TABLET | Freq: Every day | ORAL | 0 refills | Status: DC

## 2018-02-08 NOTE — Telephone Encounter (Signed)
Pt left v-mail for early refill on Ambien (1-day). [place] to go [place]. [facility] [phone]. Next appt 03/01/17

## 2018-02-08 NOTE — Progress Notes (Signed)
Ok 2 day early [place].  Pt needs appt. Meredith Staggersarey Cottle, MD, DFAPA

## 2018-03-01 ENCOUNTER — Ambulatory Visit: Payer: Medicaid Other | Admitting: Psychiatry

## 2018-03-29 ENCOUNTER — Encounter: Payer: Self-pay | Admitting: Psychiatry

## 2018-03-29 ENCOUNTER — Ambulatory Visit (INDEPENDENT_AMBULATORY_CARE_PROVIDER_SITE_OTHER): Payer: Medicaid Other | Admitting: Psychiatry

## 2018-03-29 DIAGNOSIS — F4010 Social phobia, unspecified: Secondary | ICD-10-CM

## 2018-03-29 DIAGNOSIS — F902 Attention-deficit hyperactivity disorder, combined type: Secondary | ICD-10-CM

## 2018-03-29 DIAGNOSIS — F331 Major depressive disorder, recurrent, moderate: Secondary | ICD-10-CM

## 2018-03-29 DIAGNOSIS — F5105 Insomnia due to other mental disorder: Secondary | ICD-10-CM

## 2018-03-29 MED ORDER — AMPHETAMINE-DEXTROAMPHETAMINE 30 MG PO TABS: 30.0000 mg | ORAL_TABLET | Freq: Two times a day (BID) | ORAL | 0 refills | Status: DC

## 2018-03-29 MED ORDER — ZOLPIDEM TARTRATE 10 MG PO TABS: 10.0000 mg | ORAL_TABLET | Freq: Every day | ORAL | 5 refills | Status: DC

## 2018-03-29 MED ORDER — FLUOXETINE HCL 40 MG PO CAPS: 40.0000 mg | ORAL_CAPSULE | Freq: Every day | ORAL | 1 refills | Status: DC

## 2018-03-29 NOTE — Progress Notes (Signed)
Virginia Hayes [id number] 25-Sep-1960 58 y.o.  Subjective:   Patient ID:  Virginia Hayes is a 58 y.o. (DOB 15-Jan-1961) female.  Chief Complaint:  Chief Complaint  Patient presents with  . Follow-up    Medication management    HPI Virginia Hayes presents to the office today for follow-up of depression and anxiety and ADD.  Son severe OCD and completely disabled.  A big stress.  He did better on higher risperidone.  But had weight gain.  Touching issues and tics.  Avoidant.  Minimizes sx.  Has episodes of explosive anger over his OCD.  Can rant for an hour and then be remorseful.  A lot of concerns about him.  His severe OCD drives her anxiety and depression.  She won't place him.  His issues are her issues.  Gets withdrawal swimmy headed ness for hours before the next dose is due.  Adderall will help mood and social anxiety and motivation and wants to take it again.  Sometimes sits on the couch all day long.   Review of Systems:  Review of Systems  Neurological: Negative for tremors and weakness.  Psychiatric/Behavioral: Positive for dysphoric mood and sleep disturbance. Negative for agitation, behavioral problems, confusion, decreased concentration, hallucinations, self-injury and suicidal ideas. The patient is nervous/anxious. The patient is not hyperactive.     Medications: I have reviewed the patient's current medications.  Current Outpatient Medications  Medication Sig Dispense Refill  . FLUoxetine (PROZAC) 40 MG capsule Take 1 capsule (40 mg total) by mouth daily. x2 90 capsule 1  . meloxicam (MOBIC) 15 MG tablet TAKE ONE TABLET (15 MG DOSE) BY MOUTH DAILY.    Marland Kitchen zolpidem (AMBIEN) 10 MG tablet Take 1 tablet (10 mg total) by mouth at bedtime. 30 tablet 5  . amphetamine-dextroamphetamine (ADDERALL) 30 MG tablet Take 1 tablet by mouth 2 (two) times daily. 60 tablet 0   No current facility-administered medications for this visit.     Medication Side Effects: Other: SSRI  withdrawal  Allergies: No Known Allergies  Past Medical History:  Diagnosis Date  . Chest pain   . Chest pain   . Dyslipidemia   . Hyperlipidemia   . Orthopnea   . Shoulder bursitis   . SOB (shortness of breath)     Family History  Problem Relation Age of Onset  . Heart disease Father     Social History   Socioeconomic History  . Marital status: Divorced    Spouse name: Not on file  . Number of children: Not on file  . Years of education: Not on file  . Highest education level: Not on file  Occupational History  . Not on file  Social Needs  . Financial resource strain: Not on file  . Food insecurity:    Worry: Not on file    Inability: Not on file  . Transportation needs:    Medical: Not on file    Non-medical: Not on file  Tobacco Use  . Smoking status: Never Smoker  . Smokeless tobacco: Never Used  Substance and Sexual Activity  . Alcohol use: No  . Drug use: No  . Sexual activity: Not on file  Lifestyle  . Physical activity:    Days per week: Not on file    Minutes per session: Not on file  . Stress: Not on file  Relationships  . Social connections:    Talks on phone: Not on file    Gets together: Not on file  Attends religious service: Not on file    Active member of club or organization: Not on file    Attends meetings of clubs or organizations: Not on file    Relationship status: Not on file  . Intimate partner violence:    Fear of current or ex partner: Not on file    Emotionally abused: Not on file    Physically abused: Not on file    Forced sexual activity: Not on file  Other Topics Concern  . Not on file  Social History Narrative  . Not on file    Past Medical History, Surgical history, Social history, and Family history were reviewed and updated as appropriate.   Disability starts in June.  Please see review of systems for further details on the patient's review from today.   Objective:   Physical Exam:  There were no vitals  taken for this visit.  Physical Exam Constitutional:      General: She is not in acute distress.    Appearance: She is well-developed.  Musculoskeletal:        General: No deformity.  Neurological:     Mental Status: She is alert and oriented to person, place, and time.     Motor: No tremor.     Coordination: Coordination normal.     Gait: Gait normal.  Psychiatric:        Attention and Perception: Attention normal. She is attentive.        Mood and Affect: Mood is anxious and depressed. Affect is not labile, blunt, angry or inappropriate.        Speech: Speech normal.        Behavior: Behavior normal.        Thought Content: Thought content normal. Thought content does not include homicidal or suicidal ideation. Thought content does not include homicidal or suicidal plan.        Cognition and Memory: Cognition normal.        Judgment: Judgment normal.     Comments: Insight is good.     Lab Review:     Component Value Date/Time   NA 143 12/06/2011 2023   K 3.8 12/06/2011 2023   CL 106 12/06/2011 2023   CO2 31 11/10/2008 0850   GLUCOSE 123 (H) 12/06/2011 2023   BUN 14 12/06/2011 2023   CREATININE 1.00 12/06/2011 2023   CALCIUM 9.0 11/10/2008 0850   GFRNONAA >60 11/10/2008 0850   GFRAA  11/10/2008 0850    >60        The eGFR has been calculated using the MDRD equation. This calculation has not been validated in all clinical situations. eGFR's persistently <60 mL/min signify possible Chronic Kidney Disease.       Component Value Date/Time   WBC 10.4 12/06/2011 1950   RBC 4.61 12/06/2011 1950   HGB 13.6 12/06/2011 2023   HCT 40.0 12/06/2011 2023   PLT 291 12/06/2011 1950   MCV 85.7 12/06/2011 1950   MCH 29.5 12/06/2011 1950   MCHC 34.4 12/06/2011 1950   RDW 12.8 12/06/2011 1950   LYMPHSABS 2.2 12/06/2011 1950   MONOABS 0.5 12/06/2011 1950   EOSABS 0.2 12/06/2011 1950   BASOSABS 0.0 12/06/2011 1950    No results found for: POCLITH, LITHIUM   No results  found for: PHENYTOIN, PHENOBARB, VALPROATE, CBMZ   .res Assessment: Plan:    Major depressive disorder, recurrent episode, moderate (HCC)  Attention deficit hyperactivity disorder (ADHD), combined type  Social anxiety disorder  Insomnia due to  mental condition   Disc rapid metabolism of  Fluoxetine and SSRI withdrawal.  They all do that to her.  She doesn't want to change anything.  If she could remember splitting the dose it would help but she forgets. Disc genetic testing options and the metabolism of fluoxetine by 2D6.  She thinks she did the testing elsewhere and will loook for it.  OK prn ADDERALL for ADD and it helps anxiety for her too.  She plants to take it rarely.  Discussed potential benefits, risks, and side effects of stimulants with patient to include increased heart rate, palpitations, insomnia, increased anxiety, increased irritability, or decreased appetite.  Instructed patient to contact office if experiencing any significant tolerability issues.  No med changes otherwise.  FU 6 mos  Lynder Parents, MD, DFAPA   Please see After Visit Summary for patient specific instructions.  No future appointments.  No orders of the defined types were placed in this encounter.     -------------------------------

## 2018-04-18 ENCOUNTER — Other Ambulatory Visit: Payer: Self-pay | Admitting: Psychiatry

## 2018-05-16 ENCOUNTER — Telehealth: Payer: Self-pay | Admitting: Psychiatry

## 2018-05-16 ENCOUNTER — Other Ambulatory Visit: Payer: Self-pay

## 2018-05-16 MED ORDER — AMPHETAMINE-DEXTROAMPHETAMINE 30 MG PO TABS: 30.0000 mg | ORAL_TABLET | Freq: Two times a day (BID) | ORAL | 0 refills | Status: DC

## 2018-05-16 NOTE — Telephone Encounter (Signed)
Submitted to provider for approval 

## 2018-05-16 NOTE — Telephone Encounter (Signed)
Patient left vm 04/06 @2 :01 stated she needs a refill on Adderall to be sent to [facility] [address]

## 2018-06-20 ENCOUNTER — Telehealth: Payer: Self-pay | Admitting: Psychiatry

## 2018-06-20 ENCOUNTER — Other Ambulatory Visit: Payer: Self-pay

## 2018-06-20 NOTE — Telephone Encounter (Signed)
Pt left voicemail 11:07 this morning requesting a refill on Adderall. [facility]

## 2018-06-20 NOTE — Telephone Encounter (Signed)
Pended for approval.

## 2018-06-21 MED ORDER — AMPHETAMINE-DEXTROAMPHETAMINE 30 MG PO TABS: 30.0000 mg | ORAL_TABLET | Freq: Two times a day (BID) | ORAL | 0 refills | Status: DC

## 2018-07-23 ENCOUNTER — Other Ambulatory Visit: Payer: Self-pay | Admitting: Psychiatry

## 2018-09-28 ENCOUNTER — Telehealth: Payer: Self-pay | Admitting: Psychiatry

## 2018-09-28 ENCOUNTER — Other Ambulatory Visit: Payer: Self-pay

## 2018-09-28 MED ORDER — AMPHETAMINE-DEXTROAMPHETAMINE 30 MG PO TABS: 30.0000 mg | ORAL_TABLET | Freq: Two times a day (BID) | ORAL | 0 refills | Status: DC

## 2018-09-28 NOTE — Telephone Encounter (Signed)
Pt needs [facility] [place], [place] on [address].

## 2018-10-11 ENCOUNTER — Other Ambulatory Visit: Payer: Self-pay | Admitting: Psychiatry

## 2018-11-10 ENCOUNTER — Other Ambulatory Visit: Payer: Self-pay

## 2018-11-10 ENCOUNTER — Ambulatory Visit (INDEPENDENT_AMBULATORY_CARE_PROVIDER_SITE_OTHER): Payer: Medicare Other | Admitting: Psychiatry

## 2018-11-10 ENCOUNTER — Encounter: Payer: Self-pay | Admitting: Psychiatry

## 2018-11-10 DIAGNOSIS — F4010 Social phobia, unspecified: Secondary | ICD-10-CM | POA: Diagnosis not present

## 2018-11-10 DIAGNOSIS — F902 Attention-deficit hyperactivity disorder, combined type: Secondary | ICD-10-CM | POA: Diagnosis not present

## 2018-11-10 DIAGNOSIS — F3342 Major depressive disorder, recurrent, in full remission: Secondary | ICD-10-CM

## 2018-11-10 DIAGNOSIS — F5105 Insomnia due to other mental disorder: Secondary | ICD-10-CM | POA: Diagnosis not present

## 2018-11-10 MED ORDER — AMPHETAMINE-DEXTROAMPHETAMINE 30 MG PO TABS: 30.0000 mg | ORAL_TABLET | Freq: Two times a day (BID) | ORAL | 0 refills | Status: DC

## 2018-11-10 NOTE — Progress Notes (Signed)
[name] [id number] [birth year] 58 y.o.  Subjective:   Patient ID:  Virginia Hayes is a 58 y.o. (DOB [birth year]) female.  Chief Complaint:  Chief Complaint  Patient presents with  . Follow-up    Medication Management  . Depression    Medication Management  . ADD    Medication Management  . Medication Refill    Xanax    Depression        Associated symptoms include no decreased concentration and no suicidal ideas. Medication Refill Pertinent negatives include no weakness.   [name] presents to the office today for follow-up of depression and anxiety and ADD.  Last visit February 2020.  No meds were changed.  I'm doing good.  Pretty stable with the Ambien.  Occ insomnia that doesn't work.  Can't sleep without Ambien.  Patient reports stable mood and denies depressed or irritable moods.  Patient denies any recent difficulty with anxiety usually with occ spikes of anxiety re: son [name] and uses Xanax..  Patient denies difficulty with sleep initiation or maintenance. Denies appetite disturbance.  Patient reports that energy and motivation have been good.  Patient denies any difficulty with concentration.  Patient denies any suicidal ideation.  Son severe OCD and completely disabled.  A big stress.  He did better on higher risperidone.  But had weight gain.  Touching issues and tics.  Avoidant.  Minimizes sx.  Has episodes of explosive anger over his OCD.  Can rant for an hour and then be remorseful.  A lot of concerns about him.  His severe OCD drives her anxiety and depression.  She won't place him.  His issues are her issues.  Gets withdrawal swimmy headed ness for hours before the next dose is due.  Adderall will help mood and social anxiety and motivation and wants to take it again.  Sometimes sits on the couch all day long.  Doing well with Adderall usually used when works a couple of days weekly.  Review of Systems:  Review of Systems  Neurological: Negative for  tremors and weakness.  Psychiatric/Behavioral: Positive for depression. Negative for agitation, behavioral problems, confusion, decreased concentration, dysphoric mood, hallucinations, self-injury, sleep disturbance and suicidal ideas. The patient is nervous/anxious. The patient is not hyperactive.     Medications: I have reviewed the patient's current medications.  Current Outpatient Medications  Medication Sig Dispense Refill  . ALPRAZolam (XANAX) 0.5 MG tablet Take 0.5 mg by mouth at bedtime as needed for anxiety.    Marland Kitchen amphetamine-dextroamphetamine (ADDERALL) 30 MG tablet Take 1 tablet by mouth 2 (two) times daily. 60 tablet 0  . amphetamine-dextroamphetamine (ADDERALL) 30 MG tablet Take 1 tablet by mouth 2 (two) times daily. 60 tablet 0  . amphetamine-dextroamphetamine (ADDERALL) 30 MG tablet Take 1 tablet by mouth 2 (two) times daily. 60 tablet 0  . FLUoxetine (PROZAC) 40 MG capsule TAKE 2 CAPSULES BY MOUTH EVERY DAY 180 capsule 0  . meloxicam (MOBIC) 15 MG tablet TAKE ONE TABLET (15 MG DOSE) BY MOUTH DAILY.    Marland Kitchen zolpidem (AMBIEN) 10 MG tablet TAKE 1 TABLET BY MOUTH EVERYDAY AT BEDTIME 30 tablet 5  . Cholecalciferol (D 5000) 125 MCG (5000 UT) capsule Take by mouth.     No current facility-administered medications for this visit.     Medication Side Effects: Other: SSRI withdrawal  Allergies: No Known Allergies  Past Medical History:  Diagnosis Date  . Chest pain   . Chest pain   . Dyslipidemia   .  Hyperlipidemia   . Orthopnea   . Shoulder bursitis   . SOB (shortness of breath)     Family History  Problem Relation Age of Onset  . Heart disease Father     Social History   Socioeconomic History  . Marital status: Divorced    Spouse name: Not on file  . Number of children: Not on file  . Years of education: Not on file  . Highest education level: Not on file  Occupational History  . Not on file  Social Needs  . Financial resource strain: Not on file  . Food  insecurity    Worry: Not on file    Inability: Not on file  . Transportation needs    Medical: Not on file    Non-medical: Not on file  Tobacco Use  . Smoking status: Never Smoker  . Smokeless tobacco: Never Used  Substance and Sexual Activity  . Alcohol use: No  . Drug use: No  . Sexual activity: Not on file  Lifestyle  . Physical activity    Days per week: Not on file    Minutes per session: Not on file  . Stress: Not on file  Relationships  . Social Herbalist on phone: Not on file    Gets together: Not on file    Attends religious service: Not on file    Active member of club or organization: Not on file    Attends meetings of clubs or organizations: Not on file    Relationship status: Not on file  . Intimate partner violence    Fear of current or ex partner: Not on file    Emotionally abused: Not on file    Physically abused: Not on file    Forced sexual activity: Not on file  Other Topics Concern  . Not on file  Social History Narrative  . Not on file    Past Medical History, Surgical history, Social history, and Family history were reviewed and updated as appropriate.   Disability starts in June over RA.  Works PT  Please see review of systems for further details on the patient's review from today.   Objective:   Physical Exam:  There were no vitals taken for this visit.  Physical Exam Constitutional:      General: She is not in acute distress.    Appearance: She is well-developed.  Musculoskeletal:        General: No deformity.  Neurological:     Mental Status: She is alert and oriented to person, place, and time.     Motor: No tremor.     Coordination: Coordination normal.     Gait: Gait normal.  Psychiatric:        Attention and Perception: Attention normal. She is attentive.        Mood and Affect: Mood is anxious and depressed. Affect is not labile, blunt, angry or inappropriate.        Speech: Speech normal.        Behavior:  Behavior normal.        Thought Content: Thought content normal. Thought content does not include homicidal or suicidal ideation. Thought content does not include homicidal or suicidal plan.        Cognition and Memory: Cognition normal.        Judgment: Judgment normal.     Comments: Insight is good.     Lab Review:     Component Value Date/Time   NA  143 12/06/2011 2023   K 3.8 12/06/2011 2023   CL 106 12/06/2011 2023   CO2 31 11/10/2008 0850   GLUCOSE 123 (H) 12/06/2011 2023   BUN 14 12/06/2011 2023   CREATININE 1.00 12/06/2011 2023   CALCIUM 9.0 11/10/2008 0850   GFRNONAA >60 11/10/2008 0850   GFRAA  11/10/2008 0850    >60        The eGFR has been calculated using the MDRD equation. This calculation has not been validated in all clinical situations. eGFR's persistently <60 mL/min signify possible Chronic Kidney Disease.       Component Value Date/Time   WBC 10.4 12/06/2011 1950   RBC 4.61 12/06/2011 1950   HGB 13.6 12/06/2011 2023   HCT 40.0 12/06/2011 2023   PLT 291 12/06/2011 1950   MCV 85.7 12/06/2011 1950   MCH 29.5 12/06/2011 1950   MCHC 34.4 12/06/2011 1950   RDW 12.8 12/06/2011 1950   LYMPHSABS 2.2 12/06/2011 1950   MONOABS 0.5 12/06/2011 1950   EOSABS 0.2 12/06/2011 1950   BASOSABS 0.0 12/06/2011 1950    No results found for: POCLITH, LITHIUM   No results found for: PHENYTOIN, PHENOBARB, VALPROATE, CBMZ   .res Assessment: Plan:    Recurrent major depression in full remission ([place])  Attention deficit hyperactivity disorder (ADHD), combined type  Social anxiety disorder  Insomnia due to mental condition   Overall OK and satisfied with meds.    Disc rapid metabolism of  Fluoxetine and SSRI withdrawal.  They all do that to her.  She doesn't want to change anything.  If she could remember splitting the dose it would help but she forgets. Disc genetic testing options and the metabolism of fluoxetine by 2D6.  She thinks she did the testing  elsewhere and will loook for it.  OK prn ADDERALL for ADD and it helps anxiety for her too.  She plants to take it rarely.  Discussed potential benefits, risks, and side effects of stimulants with patient to include increased heart rate, palpitations, insomnia, increased anxiety, increased irritability, or decreased appetite.  Instructed patient to contact office if experiencing any significant tolerability issues.  No med changes otherwise.  FU 6 mos  Lynder Parents, MD, DFAPA   Please see After Visit Summary for patient specific instructions.  No future appointments.  No orders of the defined types were placed in this encounter.     -------------------------------

## 2019-01-09 ENCOUNTER — Other Ambulatory Visit: Payer: Self-pay | Admitting: Psychiatry

## 2019-01-30 ENCOUNTER — Other Ambulatory Visit: Payer: Self-pay | Admitting: Psychiatry

## 2019-01-30 ENCOUNTER — Telehealth: Payer: Self-pay | Admitting: Psychiatry

## 2019-01-30 MED ORDER — ZOLPIDEM TARTRATE 10 MG PO TABS: 5.0000 mg | ORAL_TABLET | Freq: Every evening | ORAL | 0 refills | Status: DC | PRN

## 2019-01-30 NOTE — Telephone Encounter (Signed)
Clarification.  The prescription was sent for 30 days with no refill

## 2019-01-30 NOTE — Telephone Encounter (Signed)
Patient called and left a message said that she is going on her honeymoon later this week and will be gone for two weeks. She needs an early refill on her Lorrin Mais because she will be out during her honeymoon. Please send in an early refill to her pharmacy. If you need to contact her at [phone]

## 2019-01-30 NOTE — Telephone Encounter (Signed)
Okay to early refill for 30 tablets a day

## 2019-01-30 NOTE — Telephone Encounter (Signed)
Last refill 01/09/2019

## 2019-03-07 ENCOUNTER — Telehealth: Payer: Self-pay | Admitting: Psychiatry

## 2019-03-07 NOTE — Telephone Encounter (Signed)
Patient called and said that she needs a refill on her adderall 30 mg to [facility] [address] [facility] [place]

## 2019-03-08 ENCOUNTER — Other Ambulatory Visit: Payer: Self-pay

## 2019-03-08 MED ORDER — AMPHETAMINE-DEXTROAMPHETAMINE 30 MG PO TABS: 30.0000 mg | ORAL_TABLET | Freq: Two times a day (BID) | ORAL | 0 refills | Status: DC

## 2019-03-08 NOTE — Telephone Encounter (Signed)
Last refill 01/17/2019, pended 3 Rx's for Dr. Jennelle Human to submit  Due back in April 2021

## 2019-05-01 ENCOUNTER — Other Ambulatory Visit: Payer: Self-pay | Admitting: Psychiatry

## 2019-06-01 ENCOUNTER — Telehealth: Payer: Self-pay | Admitting: Psychiatry

## 2019-06-01 NOTE — Telephone Encounter (Signed)
Pt requesting refill for ambien & xanax @ [facility] [address]. Next appt 6/4

## 2019-06-05 ENCOUNTER — Other Ambulatory Visit: Payer: Self-pay

## 2019-06-05 ENCOUNTER — Other Ambulatory Visit: Payer: Self-pay | Admitting: Psychiatry

## 2019-06-05 MED ORDER — ZOLPIDEM TARTRATE 10 MG PO TABS: 10.0000 mg | ORAL_TABLET | Freq: Every evening | ORAL | 1 refills | Status: DC | PRN

## 2019-06-05 MED ORDER — ALPRAZOLAM 0.5 MG PO TABS: 0.5000 mg | ORAL_TABLET | Freq: Every evening | ORAL | 0 refills | Status: DC | PRN

## 2019-06-05 NOTE — Progress Notes (Signed)
Has appointment June 4

## 2019-07-04 ENCOUNTER — Telehealth: Payer: Self-pay | Admitting: Psychiatry

## 2019-07-04 ENCOUNTER — Other Ambulatory Visit: Payer: Self-pay

## 2019-07-04 MED ORDER — AMPHETAMINE-DEXTROAMPHETAMINE 30 MG PO TABS: 30.0000 mg | ORAL_TABLET | Freq: Two times a day (BID) | ORAL | 0 refills | Status: DC

## 2019-07-04 NOTE — Telephone Encounter (Signed)
Contacted her pharmacy, she did have a Rx on file but it's already expired. Will pend for Dr. Jennelle Human to submit

## 2019-07-04 NOTE — Telephone Encounter (Signed)
Pt called requesting refill for Adderall. Next apt 6/4. [facility] is out[facility] (Verified w/ Pt)

## 2019-07-14 ENCOUNTER — Encounter: Payer: Self-pay | Admitting: Psychiatry

## 2019-07-14 ENCOUNTER — Other Ambulatory Visit: Payer: Self-pay

## 2019-07-14 ENCOUNTER — Ambulatory Visit (INDEPENDENT_AMBULATORY_CARE_PROVIDER_SITE_OTHER): Payer: Medicare HMO | Admitting: Psychiatry

## 2019-07-14 DIAGNOSIS — F902 Attention-deficit hyperactivity disorder, combined type: Secondary | ICD-10-CM

## 2019-07-14 DIAGNOSIS — F3342 Major depressive disorder, recurrent, in full remission: Secondary | ICD-10-CM | POA: Diagnosis not present

## 2019-07-14 DIAGNOSIS — F5105 Insomnia due to other mental disorder: Secondary | ICD-10-CM

## 2019-07-14 DIAGNOSIS — F4010 Social phobia, unspecified: Secondary | ICD-10-CM | POA: Diagnosis not present

## 2019-07-14 MED ORDER — FLUOXETINE HCL 40 MG PO CAPS: 80.0000 mg | ORAL_CAPSULE | Freq: Every day | ORAL | 1 refills | Status: DC

## 2019-07-14 MED ORDER — AMPHETAMINE-DEXTROAMPHETAMINE 30 MG PO TABS: 30.0000 mg | ORAL_TABLET | Freq: Two times a day (BID) | ORAL | 0 refills | Status: DC

## 2019-07-14 MED ORDER — QUETIAPINE FUMARATE 25 MG PO TABS: 25.0000 mg | ORAL_TABLET | Freq: Every day | ORAL | 1 refills | Status: DC

## 2019-07-14 NOTE — Progress Notes (Signed)
Virginia Hayes [id number] [birth year] 59 y.o.  Subjective:   Patient ID:  Virginia Hayes is a 58 y.o. (DOB 1960/03/15) female.  Chief Complaint:  Chief Complaint  Patient presents with  . Follow-up  . ADD    Depression        Associated symptoms include no decreased concentration and no suicidal ideas. Medication Refill Associated symptoms include arthralgias. Pertinent negatives include no weakness.   [name] presents to the office today for follow-up of depression and anxiety and ADD.  Last visit October 2020.  No meds were changed.  I'm doing good.  Needs Adderall to work.   S/P shoulder and gall bladder surgery. Seems fluoxetine is less effective and seems to have low serotonin feeling about 18 hours after taking fluoxetine and notices more weepy and less joy.    Ambien at 11 pm and often awake until 3-4 AM.  Taking on empty stomach.  No caffeine after lunch.   Pretty stable with the Ambien.  Occ insomnia that doesn't work.  Can't sleep without Ambien.  Patient reports stable mood and denies depressed or irritable moods.  anxiety is a little worse.  anxiety re: son [name] and uses Xanax..  Patient denies difficulty with sleep initiation or maintenance. Denies appetite disturbance.  Patient reports that energy and motivation have been good.  Patient has difficulty with concentration.  Patient denies any suicidal ideation.  Son severe OCD and completely disabled.  A big stress.  He did better on higher risperidone.  But had weight gain.  Touching issues and tics.  Avoidant.  Minimizes sx.  Has episodes of explosive anger over his OCD.  Can rant for an hour and then be remorseful.  A lot of concerns about him.  His severe OCD drives her anxiety and depression.  She won't place him.  His issues are her issues.  Gets withdrawal swimmy headed ness for hours before the next dose is due.  Adderall will help mood and social anxiety and motivation and wants to take it again.   Sometimes sits on the couch all day long.  Doing well with Adderall usually used when works a couple of days weekly.  Past Psychiatric Medication Trials:  Trazodone NR, Xanax hangover,    Review of Systems:  Review of Systems  Musculoskeletal: Positive for arthralgias.  Neurological: Negative for tremors and weakness.  Psychiatric/Behavioral: Positive for depression. Negative for agitation, behavioral problems, confusion, decreased concentration, dysphoric mood, hallucinations, self-injury, sleep disturbance and suicidal ideas. The patient is nervous/anxious. The patient is not hyperactive.     Medications: I have reviewed the patient's current medications.  Current Outpatient Medications  Medication Sig Dispense Refill  . ALPRAZolam (XANAX) 0.5 MG tablet Take 1 tablet (0.5 mg total) by mouth at bedtime as needed for anxiety. 30 tablet 0  . Cholecalciferol (D 5000) 125 MCG (5000 UT) capsule Take by mouth.    . meloxicam (MOBIC) 15 MG tablet TAKE ONE TABLET (15 MG DOSE) BY MOUTH DAILY.    Marland Kitchen zolpidem (AMBIEN) 10 MG tablet Take 1 tablet (10 mg total) by mouth at bedtime as needed. 30 tablet 1  . amphetamine-dextroamphetamine (ADDERALL) 30 MG tablet Take 1 tablet by mouth 2 (two) times daily. 60 tablet 0  . [START ON 08/11/2019] amphetamine-dextroamphetamine (ADDERALL) 30 MG tablet Take 1 tablet by mouth 2 (two) times daily. 60 tablet 0  . [START ON 09/08/2019] amphetamine-dextroamphetamine (ADDERALL) 30 MG tablet Take 1 tablet by mouth 2 (two) times daily. 60 tablet  0  . FLUoxetine (PROZAC) 40 MG capsule Take 2 capsules (80 mg total) by mouth daily. 180 capsule 1  . QUEtiapine (SEROQUEL) 25 MG tablet Take 1-2 tablets (25-50 mg total) by mouth at bedtime. 60 tablet 1   No current facility-administered medications for this visit.    Medication Side Effects: Other: SSRI withdrawal  Allergies: No Known Allergies  Past Medical History:  Diagnosis Date  . Chest pain   . Chest pain   .  Dyslipidemia   . Hyperlipidemia   . Orthopnea   . Shoulder bursitis   . SOB (shortness of breath)     Family History  Problem Relation Age of Onset  . Heart disease Father     Social History   Socioeconomic History  . Marital status: Divorced    Spouse name: Not on file  . Number of children: Not on file  . Years of education: Not on file  . Highest education level: Not on file  Occupational History  . Not on file  Tobacco Use  . Smoking status: Never Smoker  . Smokeless tobacco: Never Used  Substance and Sexual Activity  . Alcohol use: No  . Drug use: No  . Sexual activity: Not on file  Other Topics Concern  . Not on file  Social History Narrative  . Not on file   Social Determinants of Health   Financial Resource Strain:   . Difficulty of Paying Living Expenses:   Food Insecurity:   . Worried About Charity fundraiser in the Last Year:   . Arboriculturist in the Last Year:   Transportation Needs:   . Film/video editor (Medical):   Marland Kitchen Lack of Transportation (Non-Medical):   Physical Activity:   . Days of Exercise per Week:   . Minutes of Exercise per Session:   Stress:   . Feeling of Stress :   Social Connections:   . Frequency of Communication with Friends and Family:   . Frequency of Social Gatherings with Friends and Family:   . Attends Religious Services:   . Active Member of Clubs or Organizations:   . Attends Archivist Meetings:   Marland Kitchen Marital Status:   Intimate Partner Violence:   . Fear of Current or Ex-Partner:   . Emotionally Abused:   Marland Kitchen Physically Abused:   . Sexually Abused:     Past Medical History, Surgical history, Social history, and Family history were reviewed and updated as appropriate.   Disability starts in June over RA.  Works PT  Please see review of systems for further details on the patient's review from today.   Objective:   Physical Exam:  There were no vitals taken for this visit.  Physical  Exam Constitutional:      General: She is not in acute distress.    Appearance: She is well-developed.  Musculoskeletal:        General: No deformity.  Neurological:     Mental Status: She is alert and oriented to person, place, and time.     Motor: No tremor.     Coordination: Coordination normal.     Gait: Gait normal.  Psychiatric:        Attention and Perception: Attention normal. She is attentive.        Mood and Affect: Mood is anxious. Mood is not depressed. Affect is not labile, blunt, angry or inappropriate.        Speech: Speech normal.  Behavior: Behavior normal.        Thought Content: Thought content normal. Thought content does not include homicidal or suicidal ideation. Thought content does not include homicidal or suicidal plan.        Cognition and Memory: Cognition normal.        Judgment: Judgment normal.     Comments: Insight is good.     Lab Review:     Component Value Date/Time   NA 143 12/06/2011 2023   K 3.8 12/06/2011 2023   CL 106 12/06/2011 2023   CO2 31 11/10/2008 0850   GLUCOSE 123 (H) 12/06/2011 2023   BUN 14 12/06/2011 2023   CREATININE 1.00 12/06/2011 2023   CALCIUM 9.0 11/10/2008 0850   GFRNONAA >60 11/10/2008 0850   GFRAA  11/10/2008 0850    >60        The eGFR has been calculated using the MDRD equation. This calculation has not been validated in all clinical situations. eGFR's persistently <60 mL/min signify possible Chronic Kidney Disease.       Component Value Date/Time   WBC 10.4 12/06/2011 1950   RBC 4.61 12/06/2011 1950   HGB 13.6 12/06/2011 2023   HCT 40.0 12/06/2011 2023   PLT 291 12/06/2011 1950   MCV 85.7 12/06/2011 1950   MCH 29.5 12/06/2011 1950   MCHC 34.4 12/06/2011 1950   RDW 12.8 12/06/2011 1950   LYMPHSABS 2.2 12/06/2011 1950   MONOABS 0.5 12/06/2011 1950   EOSABS 0.2 12/06/2011 1950   BASOSABS 0.0 12/06/2011 1950    No results found for: POCLITH, LITHIUM   No results found for: PHENYTOIN,  PHENOBARB, VALPROATE, CBMZ   .res Assessment: Plan:    Insomnia due to mental condition - Plan: QUEtiapine (SEROQUEL) 25 MG tablet  Recurrent major depression in full remission ([place]) - Plan: FLUoxetine (PROZAC) 40 MG capsule  Attention deficit hyperactivity disorder (ADHD), combined type - Plan: amphetamine-dextroamphetamine (ADDERALL) 30 MG tablet, amphetamine-dextroamphetamine (ADDERALL) 30 MG tablet, amphetamine-dextroamphetamine (ADDERALL) 30 MG tablet  Social anxiety disorder - Plan: FLUoxetine (PROZAC) 40 MG capsule   Overall OK and satisfied with meds.    Disc rapid metabolism of  Fluoxetine and SSRI withdrawal.  They all do that to her.  She doesn't want to change anything.  If she could remember splitting the dose it would help but she forgets. Disc genetic testing options and the metabolism of fluoxetine by 2D6.  She thinks she did the testing elsewhere and will loook for it.   Recommend split fluoxetine 40 BID.  OK prn ADDERALL for ADD and it helps anxiety for her too.  She plants to take it rarely.  Discussed potential benefits, risks, and side effects of stimulants with patient to include increased heart rate, palpitations, insomnia, increased anxiety, increased irritability, or decreased appetite.  Instructed patient to contact office if experiencing any significant tolerability issues.  Disc initial insomnia. 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   Seroquel 25 to 50 mg nightly. Taper off Zolpidem Disc SE and tachyphylaxis.  Disc withdrawal.   Patient is unlikely to experience antipsychotic side effects from Seroquel at this low-dose.  FU 6 mos  Lynder Parents, MD, DFAPA   Please see After Visit Summary for patient specific instructions.  No future appointments.  No orders of the defined types were placed in this encounter.     -------------------------------

## 2019-08-11 ENCOUNTER — Other Ambulatory Visit: Payer: Self-pay | Admitting: Psychiatry

## 2019-08-11 DIAGNOSIS — F5105 Insomnia due to other mental disorder: Secondary | ICD-10-CM

## 2019-08-25 ENCOUNTER — Other Ambulatory Visit: Payer: Self-pay | Admitting: Psychiatry

## 2019-08-25 NOTE — Telephone Encounter (Signed)
Pt requesting refill for Ambien. Out completely

## 2019-08-31 ENCOUNTER — Other Ambulatory Visit: Payer: Self-pay

## 2019-08-31 ENCOUNTER — Encounter: Payer: Self-pay | Admitting: Allergy and Immunology

## 2019-08-31 ENCOUNTER — Ambulatory Visit (INDEPENDENT_AMBULATORY_CARE_PROVIDER_SITE_OTHER): Payer: Medicare HMO | Admitting: Allergy and Immunology

## 2019-08-31 ENCOUNTER — Other Ambulatory Visit: Payer: Self-pay | Admitting: Allergy and Immunology

## 2019-08-31 VITALS — BP 150/98 | HR 102 | Temp 98.3°F | Resp 16 | Ht 69.6 in | Wt 228.6 lb

## 2019-08-31 DIAGNOSIS — H1013 Acute atopic conjunctivitis, bilateral: Secondary | ICD-10-CM

## 2019-08-31 DIAGNOSIS — H1010 Acute atopic conjunctivitis, unspecified eye: Secondary | ICD-10-CM | POA: Insufficient documentation

## 2019-08-31 DIAGNOSIS — K9049 Malabsorption due to intolerance, not elsewhere classified: Secondary | ICD-10-CM | POA: Diagnosis not present

## 2019-08-31 DIAGNOSIS — J3089 Other allergic rhinitis: Secondary | ICD-10-CM | POA: Diagnosis not present

## 2019-08-31 DIAGNOSIS — R030 Elevated blood-pressure reading, without diagnosis of hypertension: Secondary | ICD-10-CM

## 2019-08-31 MED ORDER — OLOPATADINE HCL 0.2 % OP SOLN
1.0000 [drp] | Freq: Every day | OPHTHALMIC | 5 refills | Status: DC | PRN
Start: 2019-08-31 — End: 2020-11-05

## 2019-08-31 MED ORDER — LEVOCETIRIZINE DIHYDROCHLORIDE 5 MG PO TABS
5.0000 mg | ORAL_TABLET | Freq: Every day | ORAL | 5 refills | Status: DC | PRN
Start: 2019-08-31 — End: 2020-02-19

## 2019-08-31 MED ORDER — AZELASTINE-FLUTICASONE 137-50 MCG/ACT NA SUSP: 1.0000 [drp] | Freq: Two times a day (BID) | NASAL | 5 refills | Status: DC | PRN

## 2019-08-31 NOTE — Assessment & Plan Note (Signed)
Skin tests to select food allergens were negative today. The negative predictive value of food allergen skin testing is excellent (approximately 95%). While this does not appear to be an IgE mediated issue, skin testing does not rule out food intolerances which may lend to symptoms. These etiologies are suggested when elimination of the responsible food leads to symptom resolution and re-introduction of the food is followed by the return of symptoms.   The patient has been encouraged to keep a careful symptom/food journal and eliminate any food suspected of correlating with symptoms. Should symptoms concerning for anaphylaxis arise, 911 is to be called immediately.

## 2019-08-31 NOTE — Assessment & Plan Note (Addendum)
   Virginia Hayes has been made aware of the elevated blood pressure reading and has been encouraged to follow up with her primary care physician in the near future regarding this issue.  She states that her blood pressure tends to go up after taking Adderall and will follow up with her primary care physician.

## 2019-08-31 NOTE — Patient Instructions (Addendum)
Allergic rhinitis  Aeroallergen avoidance measures have been discussed and provided in written form.  A prescription has been provided for levocetirizine(Xyzal), 5 mg daily as needed.  A prescription has been provided for azelastine/fluticasone nasal spray, 1 spray per nostril twice daily as needed. Proper nasal spray technique has been discussed and demonstrated.  Nasal saline lavage (NeilMed) has been recommended as needed and prior to medicated nasal sprays along with instructions for proper administration.  For thick post nasal drainage, add guaifenesin 1200 mg (Mucinex Maximum Strength)  twice daily as needed with adequate hydration as discussed.  The patient has motivated to initiate immunotherapy to reduce symptoms and decrease medication requirement. Informed consent has been signed and allergen vaccine orders have been submitted. Medications will be decreased or discontinued as symptom relief from immunotherapy becomes evident.  Allergic conjunctivitis  Treatment plan as outlined above for allergic rhinitis.  A prescription has been provided for generic Pataday, one drop per eye daily as needed.  If insurance does not cover this medication, medicated allergy eyedrops may be purchased over-the-counter as Art therapist.  I have also recommended eye lubricant drops (i.e., Natural Tears) as needed.  Intolerance, food Skin tests to select food allergens were negative today. The negative predictive value of food allergen skin testing is excellent (approximately 95%). While this does not appear to be an IgE mediated issue, skin testing does not rule out food intolerances which may lend to symptoms. These etiologies are suggested when elimination of the responsible food leads to symptom resolution and re-introduction of the food is followed by the return of symptoms.   The patient has been encouraged to keep a careful symptom/food journal and eliminate any food suspected  of correlating with symptoms. Should symptoms concerning for anaphylaxis arise, 911 is to be called immediately.  Elevated blood-pressure reading without diagnosis of hypertension  Virginia Hayes has been made aware of the elevated blood pressure reading and has been encouraged to follow up with her primary care physician in the near future regarding this issue.  She states that her blood pressure tends to go up after taking Adderall and will follow up with her primary care physician.   Return in about 4 months (around 01/01/2020), or if symptoms worsen or fail to improve.  Control of House Dust Mite Allergen  House dust mites play a major role in allergic asthma and rhinitis.  They occur in environments with high humidity wherever human skin, the food for dust mites is found. High levels have been detected in dust obtained from mattresses, pillows, carpets, upholstered furniture, bed covers, clothes and soft toys.  The principal allergen of the house dust mite is found in its feces.  A gram of dust may contain 1,000 mites and 250,000 fecal particles.  Mite antigen is easily measured in the air during house cleaning activities.    1. Encase mattresses, including the box spring, and pillow, in an air tight cover.  Seal the zipper end of the encased mattresses with wide adhesive tape. 2. Wash the bedding in water of 130 degrees Farenheit weekly.  Avoid cotton comforters/quilts and flannel bedding: the most ideal bed covering is the dacron comforter. 3. Remove all upholstered furniture from the bedroom. 4. Remove carpets, carpet padding, rugs, and non-washable window drapes from the bedroom.  Wash drapes weekly or use plastic window coverings. 5. Remove all non-washable stuffed toys from the bedroom.  Wash stuffed toys weekly. 6. Have the room cleaned frequently with a vacuum cleaner and  a damp dust-mop.  The patient should not be in a room which is being cleaned and should wait 1 hour after cleaning before  going into the room. 7. Close and seal all heating outlets in the bedroom.  Otherwise, the room will become filled with dust-laden air.  An electric heater can be used to heat the room. 8. Reduce indoor humidity to less than 50%.  Do not use a humidifier.  Control of Mold Allergen  Mold and fungi can grow on a variety of surfaces provided certain temperature and moisture conditions exist.  Outdoor molds grow on plants, decaying vegetation and soil.  The major outdoor mold, Alternaria and Cladosporium, are found in very high numbers during hot and dry conditions.  Generally, a late Summer - Fall peak is seen for common outdoor fungal spores.  Rain will temporarily lower outdoor mold spore count, but counts rise rapidly when the rainy period ends.  The most important indoor molds are Aspergillus and Penicillium.  Dark, humid and poorly ventilated basements are ideal sites for mold growth.  The next most common sites of mold growth are the bathroom and the kitchen.  Outdoor [employer] 1. Use air conditioning and keep windows closed 2. Avoid exposure to decaying vegetation. 3. Avoid leaf raking. 4. Avoid grain handling. 5. Consider wearing a face mask if working in moldy areas.  Indoor Mold Control 1. Maintain humidity below 50%. 2. Clean washable surfaces with 5% bleach solution. 3. Remove sources e.g. Contaminated carpets.

## 2019-08-31 NOTE — Progress Notes (Signed)
New Patient Note  RE: [name] MRN: [id number] DOB: August 09, 1960 Date of Office Visit: 08/31/2019  Referring provider: Vernona Rieger, MD Primary care provider: Catha Gosselin, MD  Chief Complaint: Allergic Rhinitis  (itchy eyes, itchy throat, sneezing, congestion) and Food Intolerance (possible dairy, wheat/gluten-itchy face and throat)   History of present illness: Virginia Hayes is a 59 y.o. female seen today in consultation requested by Vernona Rieger, MD.  She reports that over the past 15 or 16 years she has experienced nasal congestion, rhinorrhea, thick postnasal drainage, sneezing, nasal pruritus, ocular pruritus, and sinus pressure over the cheekbones and behind the eyes.  The symptoms have progressed and over this past year have become "just horrible."  The symptoms occur year-round but may be more frequent and severe during the springtime and in the fall.  She has tried numerous antihistamines as well as fluticasone nasal spray without adequate symptom relief.  She reports that over the past several years that after consuming wheat she experiences brain fog as well as some facial pruritus.  After consuming cows milk dairy products she experiences increased phlegm production as well as abdominal bloating.    Assessment and plan: Allergic rhinitis  Aeroallergen avoidance measures have been discussed and provided in written form.  A prescription has been provided for levocetirizine(Xyzal), 5 mg daily as needed.  A prescription has been provided for azelastine/fluticasone nasal spray, 1 spray per nostril twice daily as needed. Proper nasal spray technique has been discussed and demonstrated.  Nasal saline lavage (NeilMed) has been recommended as needed and prior to medicated nasal sprays along with instructions for proper administration.  For thick post nasal drainage, add guaifenesin 1200 mg (Mucinex Maximum Strength)  twice daily as needed with adequate hydration as  discussed.  The patient has motivated to initiate immunotherapy to reduce symptoms and decrease medication requirement. Informed consent has been signed and allergen vaccine orders have been submitted. Medications will be decreased or discontinued as symptom relief from immunotherapy becomes evident.  Allergic conjunctivitis  Treatment plan as outlined above for allergic rhinitis.  A prescription has been provided for generic Pataday, one drop per eye daily as needed.  If insurance does not cover this medication, medicated allergy eyedrops may be purchased over-the-counter as Art therapist.  I have also recommended eye lubricant drops (i.e., Natural Tears) as needed.  Intolerance, food Skin tests to select food allergens were negative today. The negative predictive value of food allergen skin testing is excellent (approximately 95%). While this does not appear to be an IgE mediated issue, skin testing does not rule out food intolerances which may lend to symptoms. These etiologies are suggested when elimination of the responsible food leads to symptom resolution and re-introduction of the food is followed by the return of symptoms.   The patient has been encouraged to keep a careful symptom/food journal and eliminate any food suspected of correlating with symptoms. Should symptoms concerning for anaphylaxis arise, 911 is to be called immediately.  Elevated blood-pressure reading without diagnosis of hypertension  Cece has been made aware of the elevated blood pressure reading and has been encouraged to follow up with her primary care physician in the near future regarding this issue.  She states that her blood pressure tends to go up after taking Adderall and will follow up with her primary care physician.   Meds ordered this encounter  Medications  . levocetirizine (XYZAL) 5 MG tablet    Sig: Take 1 tablet (5  mg total) by mouth daily as needed for allergies.     Dispense:  30 tablet    Refill:  5  . Azelastine-Fluticasone 137-50 MCG/ACT SUSP    Sig: Place 1 drop into both nostrils 2 (two) times daily as needed.    Dispense:  23 g    Refill:  5  . Olopatadine HCl (PATADAY) 0.2 % SOLN    Sig: Place 1 drop into both eyes daily as needed.    Dispense:  2.5 mL    Refill:  5    Diagnostics: Environmental skin testing: Reactivity to multiple molds and dust mite antigen. Food allergen skin testing: Negative despite a positive histamine control.    Physical examination: Blood pressure (!) 150/98, pulse 102, temperature 98.3 F (36.8 C), temperature source Temporal, resp. rate 16, height 5' 9.6" (1.768 m), weight (!) 228 lb 9.6 oz (103.7 kg), SpO2 98 %.  General: Alert, interactive, in no acute distress. HEENT: TMs pearly gray, turbinates edematous with clear discharge, post-pharynx moderately erythematous. Neck: Supple without lymphadenopathy. Lungs: Clear to auscultation without wheezing, rhonchi or rales. CV: Normal S1, S2 without murmurs. Abdomen: Nondistended, nontender. Skin: Warm and dry, without lesions or rashes. Extremities:  No clubbing, cyanosis or edema. Neuro:   Grossly intact.  Review of systems:  Review of systems negative except as noted in HPI / PMHx or noted below: Review of Systems  Constitutional: Negative.   HENT: Negative.   Eyes: Negative.   Respiratory: Negative.   Cardiovascular: Negative.   Gastrointestinal: Negative.   Genitourinary: Negative.   Musculoskeletal: Negative.   Skin: Negative.   Neurological: Negative.   Endo/Heme/Allergies: Negative.   Psychiatric/Behavioral: Negative.    Past medical history:  Past Medical History:  Diagnosis Date  . Chest pain   . Chest pain   . Depression   . Dyslipidemia   . Hyperlipidemia   . Insomnia   . Orthopnea   . RA (rheumatoid arthritis) ([facility])   . Shoulder bursitis   . SOB (shortness of breath)     Past surgical history:  Past Surgical History:    Procedure Laterality Date  . ABDOMINAL HYSTERECTOMY    . ABDOMINAL HYSTERECTOMY W/ PARTIAL VAGINACTOMY    . CHOLECYSTECTOMY    . left knee     . ROTATOR CUFF REPAIR      Family history: Family History  Problem Relation Age of Onset  . Heart disease Father     Social history: Social History   Socioeconomic History  . Marital status: Divorced    Spouse name: Not on file  . Number of children: Not on file  . Years of education: Not on file  . Highest education level: Not on file  Occupational History  . Not on file  Tobacco Use  . Smoking status: Never Smoker  . Smokeless tobacco: Never Used  Vaping Use  . Vaping Use: Never used  Substance and Sexual Activity  . Alcohol use: No  . Drug use: No  . Sexual activity: Not on file  Other Topics Concern  . Not on file  Social History Narrative  . Not on file   Social Determinants of Health   Financial Resource Strain:   . Difficulty of Paying Living Expenses:   Food Insecurity:   . Worried About Programme researcher, broadcasting/film/video in the Last Year:   . Barista in the Last Year:   Transportation Needs:   . Freight forwarder (Medical):   Marland Kitchen Lack of  Transportation (Non-Medical):   Physical Activity:   . Days of Exercise per Week:   . Minutes of Exercise per Session:   Stress:   . Feeling of Stress :   Social Connections:   . Frequency of Communication with Friends and Family:   . Frequency of Social Gatherings with Friends and Family:   . Attends Religious Services:   . Active Member of Clubs or Organizations:   . Attends Banker Meetings:   Marland Kitchen Marital Status:   Intimate Partner Violence:   . Fear of Current or Ex-Partner:   . Emotionally Abused:   Marland Kitchen Physically Abused:   . Sexually Abused:     Environmental History: The patient lives in an 57 year old house with hardwood floors throughout and gas heat and central air.  There is mold/water damage in the home.  There are dogs and cats and birds in the  home which do not have access to her bedroom.  She is a non-smoker.  Current Outpatient Medications  Medication Sig Dispense Refill  . ALPRAZolam (XANAX) 0.5 MG tablet Take 1 tablet (0.5 mg total) by mouth at bedtime as needed for anxiety. 30 tablet 0  . amphetamine-dextroamphetamine (ADDERALL) 30 MG tablet Take 1 tablet by mouth 2 (two) times daily. 60 tablet 0  . Cholecalciferol (D 5000) 125 MCG (5000 UT) capsule Take by mouth.    . etanercept (ENBREL) 50 MG/ML injection Inject 50 mg into the skin once a week.    . fexofenadine (ALLEGRA) 60 MG tablet Take 60 mg by mouth 2 (two) times daily.    Marland Kitchen FLUoxetine (PROZAC) 40 MG capsule Take 2 capsules (80 mg total) by mouth daily. 180 capsule 1  . meloxicam (MOBIC) 15 MG tablet TAKE ONE TABLET (15 MG DOSE) BY MOUTH DAILY.    . pantoprazole (PROTONIX) 20 MG tablet Take 20 mg by mouth daily.    . QUEtiapine (SEROQUEL) 25 MG tablet TAKE 1-2 TABLETS (25-50 MG TOTAL) BY MOUTH AT BEDTIME. 180 tablet 1  . rosuvastatin (CRESTOR) 10 MG tablet Take 10 mg by mouth daily.    Marland Kitchen UNABLE TO FIND once a week. Embrel injection    . zolpidem (AMBIEN) 10 MG tablet TAKE 1 TABLET (10 MG TOTAL) BY MOUTH AT BEDTIME AS NEEDED. 30 tablet 2  . amphetamine-dextroamphetamine (ADDERALL) 30 MG tablet Take 1 tablet by mouth 2 (two) times daily. (Patient not taking: Reported on 08/31/2019) 60 tablet 0  . [START ON 09/08/2019] amphetamine-dextroamphetamine (ADDERALL) 30 MG tablet Take 1 tablet by mouth 2 (two) times daily. (Patient not taking: Reported on 08/31/2019) 60 tablet 0  . Azelastine-Fluticasone 137-50 MCG/ACT SUSP Place 1 drop into both nostrils 2 (two) times daily as needed. 23 g 5  . levocetirizine (XYZAL) 5 MG tablet Take 1 tablet (5 mg total) by mouth daily as needed for allergies. 30 tablet 5  . Olopatadine HCl (PATADAY) 0.2 % SOLN Place 1 drop into both eyes daily as needed. 2.5 mL 5   No current facility-administered medications for this visit.    Known medication  allergies: No Known Allergies  I appreciate the opportunity to take part in Maleni's care. Please do not hesitate to contact me with questions.  Sincerely,   R. Jorene Guest, MD

## 2019-08-31 NOTE — Assessment & Plan Note (Addendum)
   Aeroallergen avoidance measures have been discussed and provided in written form.  A prescription has been provided for levocetirizine(Xyzal), 5 mg daily as needed.  A prescription has been provided for azelastine/fluticasone nasal spray, 1 spray per nostril twice daily as needed. Proper nasal spray technique has been discussed and demonstrated.  Nasal saline lavage (NeilMed) has been recommended as needed and prior to medicated nasal sprays along with instructions for proper administration.  For thick post nasal drainage, add guaifenesin 1200 mg (Mucinex Maximum Strength)  twice daily as needed with adequate hydration as discussed.  The patient has motivated to initiate immunotherapy to reduce symptoms and decrease medication requirement. Informed consent has been signed and allergen vaccine orders have been submitted. Medications will be decreased or discontinued as symptom relief from immunotherapy becomes evident.

## 2019-08-31 NOTE — Assessment & Plan Note (Signed)
   Treatment plan as outlined above for allergic rhinitis.  A prescription has been provided for generic Pataday, one drop per eye daily as needed.  If insurance does not cover this medication, medicated allergy eyedrops may be purchased over-the-counter as Pataday Extra Strength or Zaditor.  I have also recommended eye lubricant drops (i.e., Natural Tears) as needed. 

## 2019-09-01 MED ORDER — AZELASTINE HCL 0.1 % NA SOLN: 2.0000 | Freq: Two times a day (BID) | NASAL | 5 refills | Status: AC

## 2019-09-01 NOTE — Progress Notes (Signed)
EXP 09/03/20

## 2019-09-04 DIAGNOSIS — J301 Allergic rhinitis due to pollen: Secondary | ICD-10-CM

## 2019-09-05 DIAGNOSIS — J3089 Other allergic rhinitis: Secondary | ICD-10-CM

## 2019-09-20 ENCOUNTER — Ambulatory Visit: Payer: Medicare HMO

## 2019-10-05 IMAGING — DX DG CHEST 2V
2 series · 2 of 2 positions shown · non-contrast
Comparison: 11/10/2008 chest CT

CLINICAL DATA: Shortness of breath on and off for 1 year. History
of chest pain.

EXAM:
CHEST  2 VIEW

[dg chest 2 view (1 of 2)]
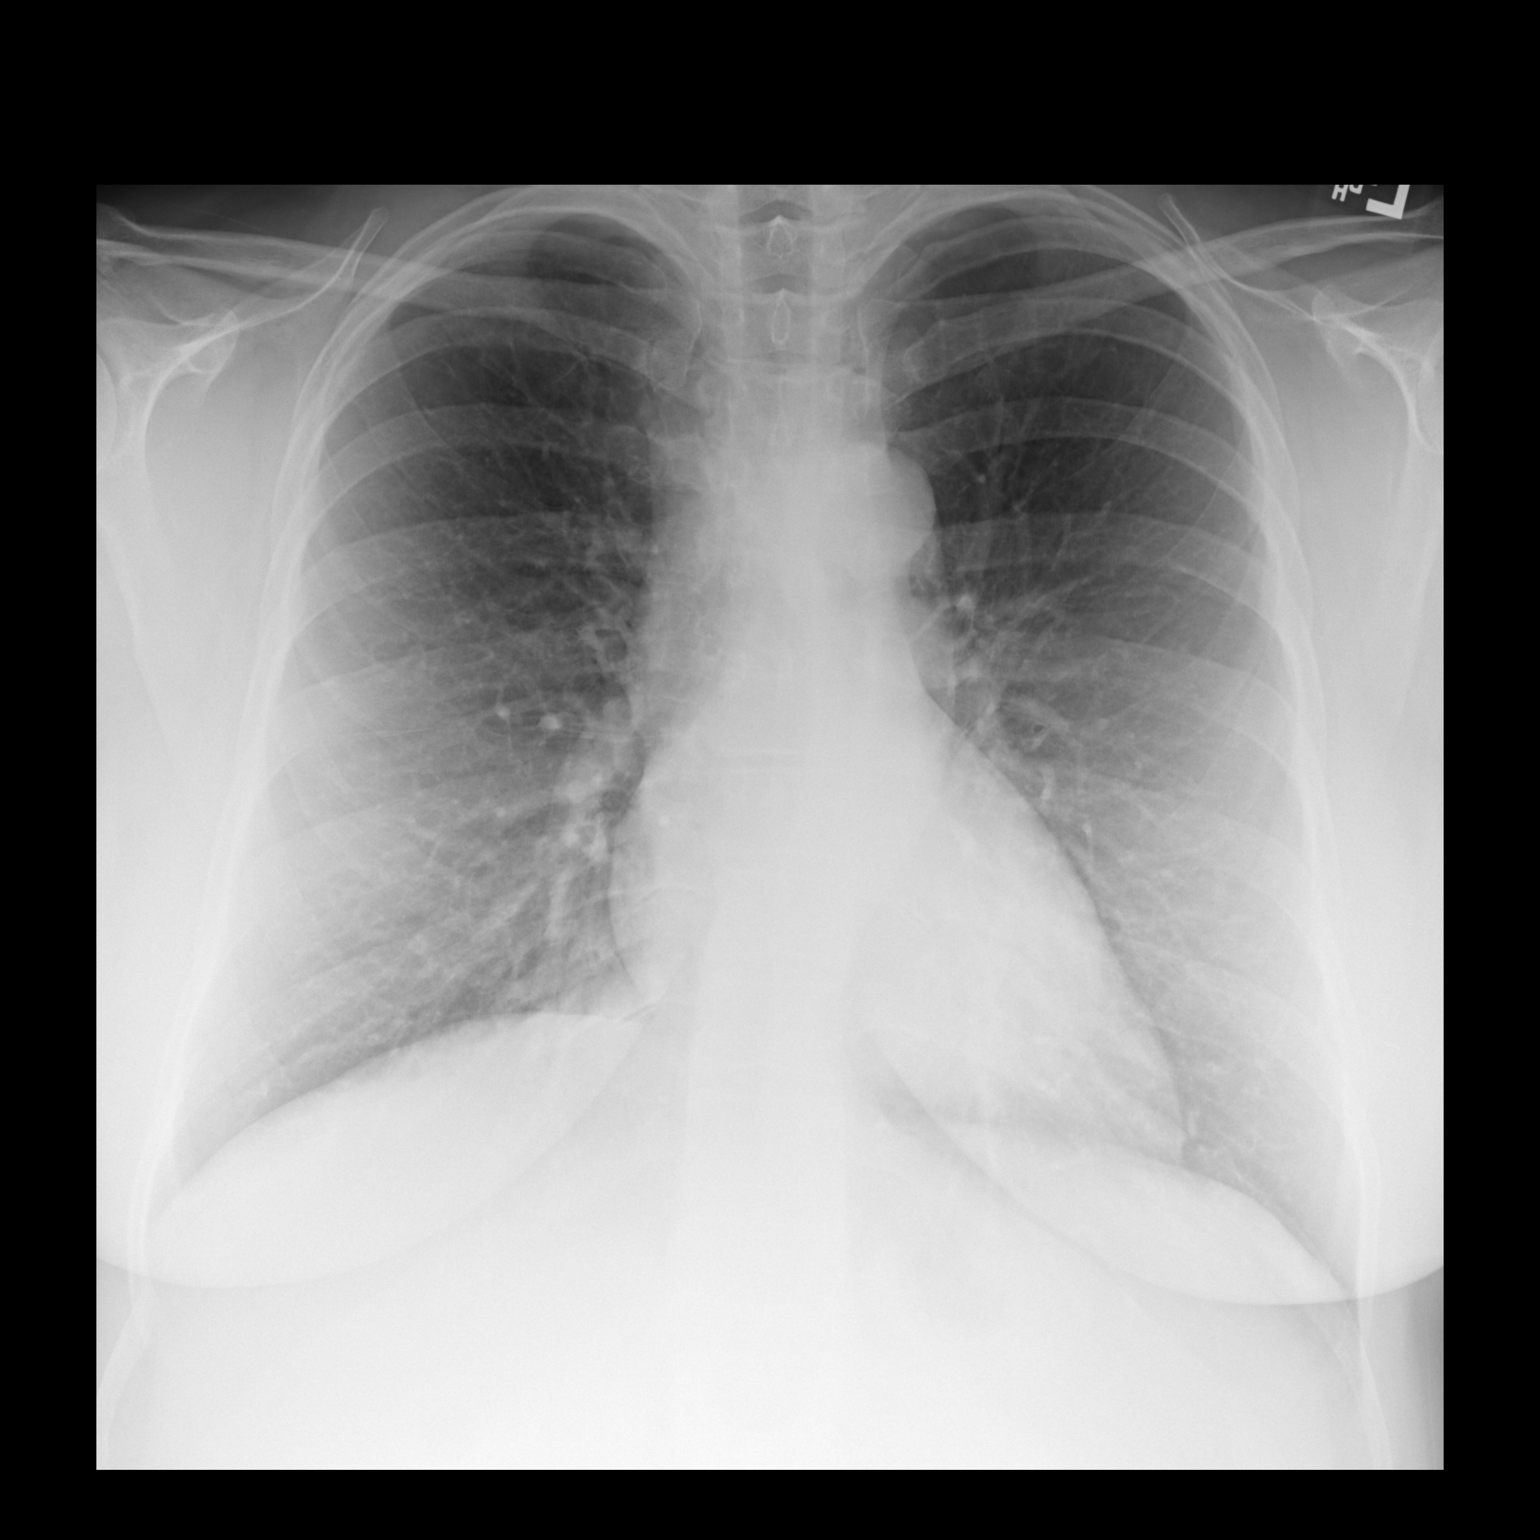

[dg chest 2 view (2 of 2)]
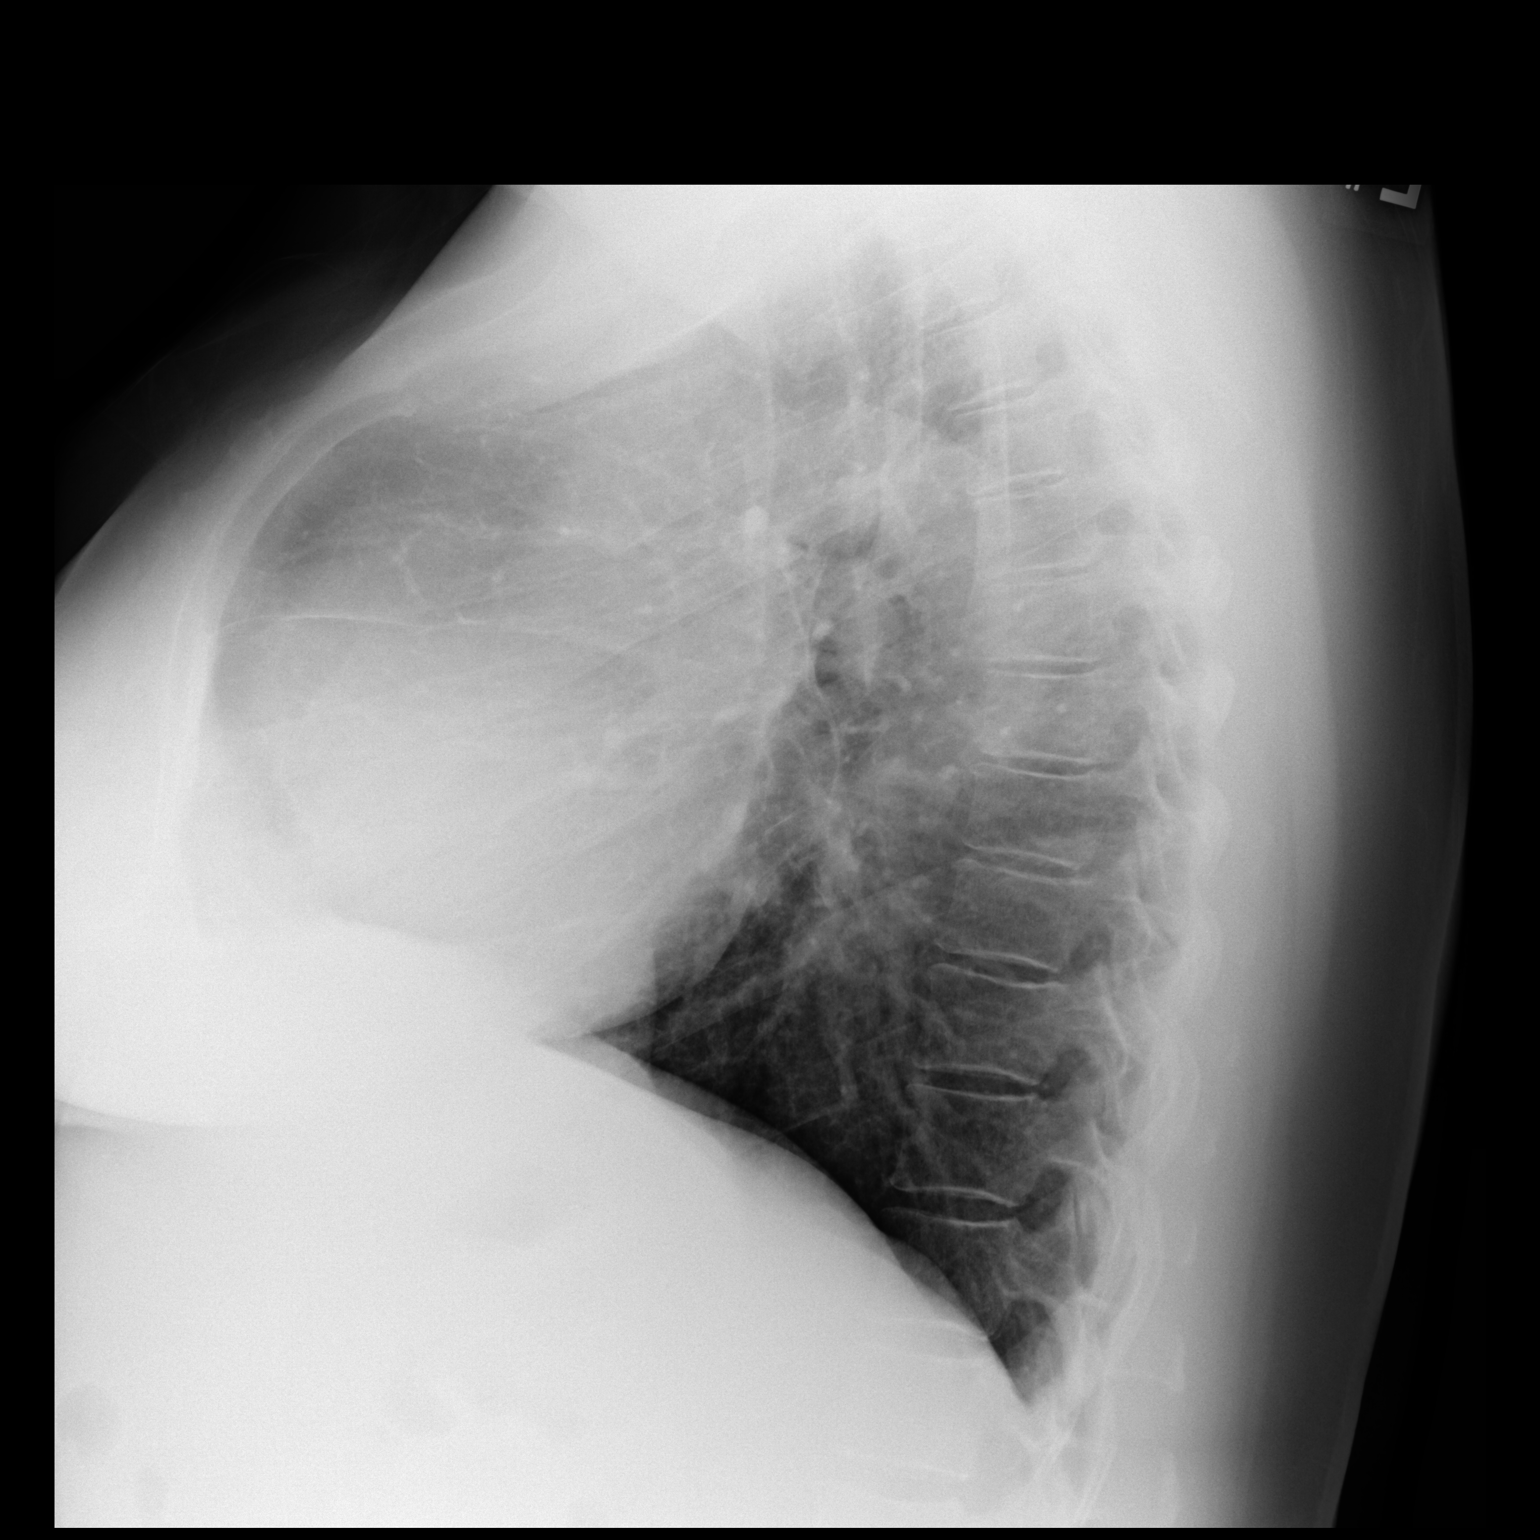

[2 of 2 positions shown; findings below may reference images not displayed]

FINDINGS: Normal heart size when accounting for pectus carinatum and apical
fat pad. The ventral heart border is prominent in the lateral
projection but stable and there was no cardiomegaly on 11/10/2008
chest CT. There is no edema, consolidation, effusion, or
pneumothorax.
IMPRESSION: 1. No evidence of active disease.  Stable chest compared to 5555.
2. Pectus carinatum.

## 2019-11-21 ENCOUNTER — Telehealth: Payer: Self-pay | Admitting: Psychiatry

## 2019-11-21 ENCOUNTER — Other Ambulatory Visit: Payer: Self-pay

## 2019-11-21 MED ORDER — ZOLPIDEM TARTRATE 10 MG PO TABS: 10.0000 mg | ORAL_TABLET | Freq: Every evening | ORAL | 2 refills | Status: DC | PRN

## 2019-11-21 NOTE — Telephone Encounter (Signed)
Pt LM on VM requesting new Rx for Ambien no refills to [facility] [address] [place] on file. Apt 12/8  6 mo follow up.

## 2019-11-21 NOTE — Telephone Encounter (Signed)
Rx sent 

## 2019-11-22 NOTE — Telephone Encounter (Signed)
No needed

## 2019-12-27 ENCOUNTER — Ambulatory Visit: Payer: Medicare HMO | Admitting: Allergy and Immunology

## 2020-01-14 ENCOUNTER — Other Ambulatory Visit: Payer: Self-pay | Admitting: Psychiatry

## 2020-01-14 DIAGNOSIS — F4010 Social phobia, unspecified: Secondary | ICD-10-CM

## 2020-01-14 DIAGNOSIS — F3342 Major depressive disorder, recurrent, in full remission: Secondary | ICD-10-CM

## 2020-01-15 ENCOUNTER — Other Ambulatory Visit: Payer: Self-pay | Admitting: Psychiatry

## 2020-01-15 DIAGNOSIS — F5105 Insomnia due to other mental disorder: Secondary | ICD-10-CM

## 2020-01-15 NOTE — Telephone Encounter (Signed)
Pt LM on VM requesting  [facility].

## 2020-01-16 ENCOUNTER — Other Ambulatory Visit: Payer: Self-pay | Admitting: Psychiatry

## 2020-01-16 DIAGNOSIS — F902 Attention-deficit hyperactivity disorder, combined type: Secondary | ICD-10-CM

## 2020-01-16 MED ORDER — AMPHETAMINE-DEXTROAMPHETAMINE 30 MG PO TABS: 30.0000 mg | ORAL_TABLET | Freq: Two times a day (BID) | ORAL | 0 refills | Status: DC

## 2020-01-17 ENCOUNTER — Ambulatory Visit: Payer: Medicare HMO | Admitting: Psychiatry

## 2020-02-12 ENCOUNTER — Telehealth: Payer: Self-pay | Admitting: Psychiatry

## 2020-02-12 ENCOUNTER — Other Ambulatory Visit: Payer: Self-pay | Admitting: Psychiatry

## 2020-02-12 MED ORDER — ZOLPIDEM TARTRATE 10 MG PO TABS: 10.0000 mg | ORAL_TABLET | Freq: Every evening | ORAL | 0 refills | Status: DC | PRN

## 2020-02-12 NOTE — Telephone Encounter (Signed)
Pt is requesting a refill on her  Ambien. Pt is scheduled for a follow up on 1/31

## 2020-02-19 ENCOUNTER — Other Ambulatory Visit: Payer: Self-pay | Admitting: Allergy and Immunology

## 2020-03-11 ENCOUNTER — Other Ambulatory Visit: Payer: Self-pay

## 2020-03-11 ENCOUNTER — Ambulatory Visit (INDEPENDENT_AMBULATORY_CARE_PROVIDER_SITE_OTHER): Payer: Medicare HMO | Admitting: Psychiatry

## 2020-03-11 ENCOUNTER — Encounter: Payer: Self-pay | Admitting: Psychiatry

## 2020-03-11 DIAGNOSIS — F4010 Social phobia, unspecified: Secondary | ICD-10-CM | POA: Diagnosis not present

## 2020-03-11 DIAGNOSIS — F3342 Major depressive disorder, recurrent, in full remission: Secondary | ICD-10-CM

## 2020-03-11 DIAGNOSIS — F5105 Insomnia due to other mental disorder: Secondary | ICD-10-CM | POA: Diagnosis not present

## 2020-03-11 DIAGNOSIS — F902 Attention-deficit hyperactivity disorder, combined type: Secondary | ICD-10-CM

## 2020-03-11 MED ORDER — FLUOXETINE HCL 40 MG PO CAPS
80.0000 mg | ORAL_CAPSULE | Freq: Every day | ORAL | 1 refills | Status: DC
Start: 2020-03-11 — End: 2020-11-05

## 2020-03-11 MED ORDER — AMPHETAMINE-DEXTROAMPHETAMINE 20 MG PO TABS: 20.0000 mg | ORAL_TABLET | Freq: Two times a day (BID) | ORAL | 0 refills | Status: DC

## 2020-03-11 MED ORDER — ZOLPIDEM TARTRATE 10 MG PO TABS: 10.0000 mg | ORAL_TABLET | Freq: Every evening | ORAL | 0 refills | Status: DC | PRN

## 2020-03-11 MED ORDER — QUETIAPINE FUMARATE 25 MG PO TABS: 25.0000 mg | ORAL_TABLET | Freq: Every day | ORAL | 1 refills | Status: DC

## 2020-03-11 NOTE — Progress Notes (Signed)
Virginia Hayes [id number] 12/25/1960 60 y.o.  Subjective:   Patient ID:  Virginia Hayes is a 60 y.o. (DOB Nov 26, 1960) female.  Chief Complaint:  Chief Complaint  Patient presents with  . Follow-up  . Sleeping Problem    Depression        Associated symptoms include no decreased concentration and no suicidal ideas. Medication Refill Associated symptoms include arthralgias. Pertinent negatives include no weakness.   Virginia Hayes presents to the office today for follow-up of depression and anxiety and ADD.  07/2019 appt noted: I'm doing good.  Needs Adderall to work.   S/P shoulder and gall bladder surgery. Seems fluoxetine is less effective and seems to have low serotonin feeling about 18 hours after taking fluoxetine and notices more weepy and less joy.   Ambien at 11 pm and often awake until 3-4 AM.  Taking on empty stomach.  No caffeine after lunch.   Pretty stable with the Ambien.  Occ insomnia that doesn't work.  Can't sleep without Ambien.  Patient reports stable mood and denies depressed or irritable moods.  anxiety is a little worse.  anxiety re: son [name] and uses Xanax..  Patient denies difficulty with sleep initiation or maintenance. Denies appetite disturbance.  Patient reports that energy and motivation have been good.  Patient has difficulty with concentration.  Patient denies any suicidal ideation. Plan: Disc initial insomnia. 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   Seroquel 25 to 50 mg nightly. Taper off Zolpidem  03/11/2020 appointment noted: Doing fine back on zolpidem. Satisfied with meds.  Quetiapine 25 mg didn't work without Ambien but good with it.  Stays asleep without problems with quetiapine. Good with other meds. Patient reports stable mood and denies depressed or irritable moods.  Patient denies any recent difficulty with anxiety.  Patient denies difficulty with sleep initiation or maintenance. Denies appetite disturbance.  Patient reports  that energy and motivation have been good.  Patient denies any difficulty with concentration.  Patient denies any suicidal ideation.  Son severe OCD and completely disabled.  A big stress.  He did better on higher risperidone.  But had weight gain.  Touching issues and tics.  Avoidant.  Minimizes sx.  Has episodes of explosive anger over his OCD.  Can rant for an hour and then be remorseful.  A lot of concerns about him.  His severe OCD drives her anxiety and depression.  She won't place him.  His issues are her issues.  Gets withdrawal swimmy headed ness for hours before the next dose is due.  Adderall will help mood and social anxiety and motivation and wants to take it again.  Sometimes sits on the couch all day long.  Doing well with Adderall usually used when works a couple of days weekly.  Past Psychiatric Medication Trials:  Trazodone NR, Xanax hangover,    Review of Systems:  Review of Systems  Cardiovascular: Negative for palpitations.  Musculoskeletal: Positive for arthralgias.  Neurological: Negative for tremors and weakness.  Psychiatric/Behavioral: Positive for depression. Negative for agitation, behavioral problems, confusion, decreased concentration, dysphoric mood, hallucinations, self-injury, sleep disturbance and suicidal ideas. The patient is nervous/anxious. The patient is not hyperactive.     Medications: I have reviewed the patient's current medications.  Current Outpatient Medications  Medication Sig Dispense Refill  . ALPRAZolam (XANAX) 0.5 MG tablet Take 1 tablet (0.5 mg total) by mouth at bedtime as needed for anxiety. 30 tablet 0  . [START ON 05/06/2020] amphetamine-dextroamphetamine (ADDERALL) 20  MG tablet Take 1 tablet (20 mg total) by mouth 2 (two) times daily. 60 tablet 0  . [START ON 04/08/2020] amphetamine-dextroamphetamine (ADDERALL) 20 MG tablet Take 1 tablet (20 mg total) by mouth 2 (two) times daily. 60 tablet 0  . amphetamine-dextroamphetamine  (ADDERALL) 20 MG tablet Take 1 tablet (20 mg total) by mouth 2 (two) times daily. 60 tablet 0  . azelastine (ASTELIN) 0.1 % nasal spray Place 2 sprays into both nostrils 2 (two) times daily. Use in each nostril as directed 30 mL 5  . Cholecalciferol (D 5000) 125 MCG (5000 UT) capsule Take by mouth.    . etanercept (ENBREL) 50 MG/ML injection Inject 50 mg into the skin once a week.    . fexofenadine (ALLEGRA) 60 MG tablet Take 60 mg by mouth 2 (two) times daily.    . fluticasone (FLONASE) 50 MCG/ACT nasal spray Place 1 spray into both nostrils daily as needed for allergies or rhinitis. 18.2 mL 5  . levocetirizine (XYZAL) 5 MG tablet TAKE 1 TABLET (5 MG TOTAL) BY MOUTH DAILY AS NEEDED FOR ALLERGIES. 90 tablet 1  . meloxicam (MOBIC) 15 MG tablet TAKE ONE TABLET (15 MG DOSE) BY MOUTH DAILY.    Marland Kitchen Olopatadine HCl (PATADAY) 0.2 % SOLN Place 1 drop into both eyes daily as needed. 2.5 mL 5  . pantoprazole (PROTONIX) 20 MG tablet Take 20 mg by mouth daily.    . rosuvastatin (CRESTOR) 10 MG tablet Take 10 mg by mouth daily.    Marland Kitchen UNABLE TO FIND once a week. Embrel injection    . FLUoxetine (PROZAC) 40 MG capsule Take 2 capsules (80 mg total) by mouth daily. 180 capsule 1  . QUEtiapine (SEROQUEL) 25 MG tablet Take 1-2 tablets (25-50 mg total) by mouth at bedtime. 180 tablet 1  . zolpidem (AMBIEN) 10 MG tablet Take 1 tablet (10 mg total) by mouth at bedtime as needed. 90 tablet 0   No current facility-administered medications for this visit.    Medication Side Effects: Other: SSRI withdrawal  Allergies: No Known Allergies  Past Medical History:  Diagnosis Date  . Chest pain   . Chest pain   . Depression   . Dyslipidemia   . Hyperlipidemia   . Insomnia   . Orthopnea   . RA (rheumatoid arthritis) ([place])   . Shoulder bursitis   . SOB (shortness of breath)     Family History  Problem Relation Age of Onset  . Heart disease Father     Social History   Socioeconomic History  . Marital status:  Divorced    Spouse name: Not on file  . Number of children: Not on file  . Years of education: Not on file  . Highest education level: Not on file  Occupational History  . Not on file  Tobacco Use  . Smoking status: Never Smoker  . Smokeless tobacco: Never Used  Vaping Use  . Vaping Use: Never used  Substance and Sexual Activity  . Alcohol use: No  . Drug use: No  . Sexual activity: Not on file  Other Topics Concern  . Not on file  Social History Narrative  . Not on file   Social Determinants of Health   Financial Resource Strain: Not on file  Food Insecurity: Not on file  Transportation Needs: Not on file  Physical Activity: Not on file  Stress: Not on file  Social Connections: Not on file  Intimate Partner Violence: Not on file    Past  Medical History, Surgical history, Social history, and Family history were reviewed and updated as appropriate.   Disability starts in June over RA.  Works PT  Please see review of systems for further details on the patient's review from today.   Objective:   Physical Exam:  There were no vitals taken for this visit.  Physical Exam Constitutional:      General: She is not in acute distress.    Appearance: She is well-developed.  Musculoskeletal:        General: No deformity.  Neurological:     Mental Status: She is alert and oriented to person, place, and time.     Motor: No tremor.     Coordination: Coordination normal.     Gait: Gait normal.  Psychiatric:        Attention and Perception: Attention normal. She is attentive.        Mood and Affect: Mood is not anxious or depressed. Affect is not labile, blunt, angry or inappropriate.        Speech: Speech normal.        Behavior: Behavior normal.        Thought Content: Thought content normal. Thought content does not include homicidal or suicidal ideation. Thought content does not include homicidal or suicidal plan.        Cognition and Memory: Cognition normal.         Judgment: Judgment normal.     Comments: Insight is good.     Lab Review:     Component Value Date/Time   NA 143 12/06/2011 2023   K 3.8 12/06/2011 2023   CL 106 12/06/2011 2023   CO2 31 11/10/2008 0850   GLUCOSE 123 (H) 12/06/2011 2023   BUN 14 12/06/2011 2023   CREATININE 1.00 12/06/2011 2023   CALCIUM 9.0 11/10/2008 0850   GFRNONAA >60 11/10/2008 0850   GFRAA  11/10/2008 0850    >60        The eGFR has been calculated using the MDRD equation. This calculation has not been validated in all clinical situations. eGFR's persistently <60 mL/min signify possible Chronic Kidney Disease.       Component Value Date/Time   WBC 10.4 12/06/2011 1950   RBC 4.61 12/06/2011 1950   HGB 13.6 12/06/2011 2023   HCT 40.0 12/06/2011 2023   PLT 291 12/06/2011 1950   MCV 85.7 12/06/2011 1950   MCH 29.5 12/06/2011 1950   MCHC 34.4 12/06/2011 1950   RDW 12.8 12/06/2011 1950   LYMPHSABS 2.2 12/06/2011 1950   MONOABS 0.5 12/06/2011 1950   EOSABS 0.2 12/06/2011 1950   BASOSABS 0.0 12/06/2011 1950    No results found for: POCLITH, LITHIUM   No results found for: PHENYTOIN, PHENOBARB, VALPROATE, CBMZ   .res Assessment: Plan:    Attention deficit hyperactivity disorder (ADHD), combined type - Plan: amphetamine-dextroamphetamine (ADDERALL) 20 MG tablet, amphetamine-dextroamphetamine (ADDERALL) 20 MG tablet, amphetamine-dextroamphetamine (ADDERALL) 20 MG tablet  Recurrent major depression in full remission ([facility]) - Plan: FLUoxetine (PROZAC) 40 MG capsule  Social anxiety disorder - Plan: FLUoxetine (PROZAC) 40 MG capsule  Insomnia due to mental condition - Plan: QUEtiapine (SEROQUEL) 25 MG tablet, zolpidem (AMBIEN) 10 MG tablet   Overall OK and satisfied with meds.    Disc rapid metabolism of  Fluoxetine and SSRI withdrawal.  They all do that to her.  She doesn't want to change anything.  If she could remember splitting the dose it would help but she forgets. Disc genetic testing  options and the metabolism of fluoxetine by 2D6.  She thinks she did the testing elsewhere and will loook for it.   Recommend split fluoxetine 40 BID.  OK prn ADDERALL for ADD and it helps anxiety for her too.  She plants to take it rarely.  Discussed potential benefits, risks, and side effects of stimulants with patient to include increased heart rate, palpitations, insomnia, increased anxiety, increased irritability, or decreased appetite.  Instructed patient to contact office if experiencing any significant tolerability issues.  Disc initial insomnia. 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   Seroquel 25 to 50 mg nightly. OK to continue zolpidem Disc SE and tachyphylaxis.  Disc withdrawal.   Patient is unlikely to experience antipsychotic side effects from Seroquel at this low-dose.  FU 6 mos  Lynder Parents, MD, DFAPA   Please see After Visit Summary for patient specific instructions.  No future appointments.  No orders of the defined types were placed in this encounter.     -------------------------------

## 2020-05-29 ENCOUNTER — Other Ambulatory Visit: Payer: Self-pay | Admitting: Psychiatry

## 2020-05-29 DIAGNOSIS — F5105 Insomnia due to other mental disorder: Secondary | ICD-10-CM

## 2020-05-30 ENCOUNTER — Other Ambulatory Visit: Payer: Self-pay | Admitting: Psychiatry

## 2020-05-30 ENCOUNTER — Telehealth: Payer: Self-pay | Admitting: Psychiatry

## 2020-05-30 DIAGNOSIS — F5105 Insomnia due to other mental disorder: Secondary | ICD-10-CM

## 2020-05-30 MED ORDER — ZOLPIDEM TARTRATE 10 MG PO TABS: 10.0000 mg | ORAL_TABLET | Freq: Every evening | ORAL | 0 refills | Status: DC | PRN

## 2020-05-30 NOTE — Telephone Encounter (Signed)
Requesting refill on Ambien called to:  [id number] - [place], [place] - [address] Phone:  [phone]  Fax:  [phone]

## 2020-05-30 NOTE — Telephone Encounter (Signed)
Next visit is 08/20/20. Requesting refill on Ambien called to

## 2020-05-30 NOTE — Telephone Encounter (Signed)
Last filled 03/11/20 90 day rx.appt on 08/20/20

## 2020-06-25 ENCOUNTER — Other Ambulatory Visit: Payer: Self-pay | Admitting: Psychiatry

## 2020-08-20 ENCOUNTER — Ambulatory Visit: Payer: Medicare HMO | Admitting: Psychiatry

## 2020-08-20 ENCOUNTER — Other Ambulatory Visit: Payer: Self-pay

## 2020-08-20 ENCOUNTER — Encounter: Payer: Self-pay | Admitting: Psychiatry

## 2020-08-20 VITALS — BP 134/86 | HR 87

## 2020-08-20 DIAGNOSIS — F902 Attention-deficit hyperactivity disorder, combined type: Secondary | ICD-10-CM

## 2020-08-20 DIAGNOSIS — F4010 Social phobia, unspecified: Secondary | ICD-10-CM | POA: Diagnosis not present

## 2020-08-20 DIAGNOSIS — F5105 Insomnia due to other mental disorder: Secondary | ICD-10-CM | POA: Diagnosis not present

## 2020-08-20 DIAGNOSIS — F3342 Major depressive disorder, recurrent, in full remission: Secondary | ICD-10-CM

## 2020-08-20 NOTE — Progress Notes (Signed)
Virginia Hayes [id number] Dec 30, 1960 60 y.o.  Subjective:   Patient ID:  Virginia Hayes is a 60 y.o. (DOB 04/24/1960) female.  Chief Complaint:  Chief Complaint  Patient presents with   Follow-up   Depression   Anxiety    Depression        Associated symptoms include no decreased concentration and no suicidal ideas. Medication Refill Associated symptoms include arthralgias. Pertinent negatives include no chest pain or weakness.  Shakeisha A Cureton presents to the office today for follow-up of depression and anxiety and ADD.  07/2019 appt noted: I'm doing good.  Needs Adderall to work.   S/P shoulder and gall bladder surgery. Seems fluoxetine is less effective and seems to have low serotonin feeling about 18 hours after taking fluoxetine and notices more weepy and less joy.   Ambien at 11 pm and often awake until 3-4 AM.  Taking on empty stomach.  No caffeine after lunch.   Pretty stable with the Ambien.  Occ insomnia that doesn't work.  Can't sleep without Ambien.  Patient reports stable mood and denies depressed or irritable moods.  anxiety is a little worse.  anxiety re: son [name] and uses Xanax..  Patient denies difficulty with sleep initiation or maintenance. Denies appetite disturbance.  Patient reports that energy and motivation have been good.  Patient has difficulty with concentration.  Patient denies any suicidal ideation. Plan: Disc initial insomnia. 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   Seroquel 25 to 50 mg nightly. Taper off Zolpidem  03/11/2020 appointment noted: Doing fine back on zolpidem. Satisfied with meds.  Quetiapine 25 mg didn't work without Ambien but good with it.  Stays asleep without problems with quetiapine. Good with other meds. Patient reports stable mood and denies depressed or irritable moods.  Patient denies any recent difficulty with anxiety.  Patient denies difficulty with sleep initiation or maintenance. Denies appetite disturbance.   Patient reports that energy and motivation have been good.  Patient denies any difficulty with concentration.  Patient denies any suicidal ideation.  Son severe OCD and completely disabled.  A big stress.  He did better on higher risperidone.  But had weight gain.  Touching issues and tics.  Avoidant.  Minimizes sx.  Has episodes of explosive anger over his OCD.  Can rant for an hour and then be remorseful.  A lot of concerns about him.  His severe OCD drives her anxiety and depression.  She won't place him.  His issues are her issues.  Gets withdrawal swimmy headed ness for hours before the next dose is due.  Adderall will help mood and social anxiety and motivation and wants to take it again.  Sometimes sits on the couch all day long.  08/20/20 appt noted: Retired.  Enjoys [employer].  Tough dealing with [name] who has bad OCD and anger outbursts.  He needs counseling.  His sx worsen her sx. Thinks prozac works but maybe too well.   Past Psychiatric Medication Trials:  Trazodone NR, Xanax hangover,  Zoloft, paroxetine, Wellbutrin, Lexapro, Prozac  Review of Systems:  Review of Systems  Cardiovascular:  Negative for chest pain and palpitations.  Musculoskeletal:  Positive for arthralgias.  Neurological:  Negative for tremors and weakness.  Psychiatric/Behavioral:  Negative for agitation, behavioral problems, confusion, decreased concentration, dysphoric mood, hallucinations, self-injury, sleep disturbance and suicidal ideas. The patient is nervous/anxious. The patient is not hyperactive.    Medications: I have reviewed the patient's current medications.  Current Outpatient Medications  Medication  Sig Dispense Refill   ALPRAZolam (XANAX) 0.5 MG tablet TAKE 1 TABLET BY MOUTH AT BEDTIME AS NEEDED FOR ANXIETY. 30 tablet 1   amphetamine-dextroamphetamine (ADDERALL) 20 MG tablet Take 1 tablet (20 mg total) by mouth 2 (two) times daily. 60 tablet 0   amphetamine-dextroamphetamine (ADDERALL) 20 MG  tablet Take 1 tablet (20 mg total) by mouth 2 (two) times daily. 60 tablet 0   amphetamine-dextroamphetamine (ADDERALL) 20 MG tablet Take 1 tablet (20 mg total) by mouth 2 (two) times daily. 60 tablet 0   azelastine (ASTELIN) 0.1 % nasal spray Place 2 sprays into both nostrils 2 (two) times daily. Use in each nostril as directed 30 mL 5   Cholecalciferol (D 5000) 125 MCG (5000 UT) capsule Take by mouth.     etanercept (ENBREL) 50 MG/ML injection Inject 50 mg into the skin once a week.     FLUoxetine (PROZAC) 40 MG capsule Take 2 capsules (80 mg total) by mouth daily. 180 capsule 1   fluticasone (FLONASE) 50 MCG/ACT nasal spray Place 1 spray into both nostrils daily as needed for allergies or rhinitis. 18.2 mL 5   levocetirizine (XYZAL) 5 MG tablet TAKE 1 TABLET (5 MG TOTAL) BY MOUTH DAILY AS NEEDED FOR ALLERGIES. 90 tablet 1   meloxicam (MOBIC) 15 MG tablet TAKE ONE TABLET (15 MG DOSE) BY MOUTH DAILY.     Olopatadine HCl (PATADAY) 0.2 % SOLN Place 1 drop into both eyes daily as needed. 2.5 mL 5   pantoprazole (PROTONIX) 20 MG tablet Take 20 mg by mouth daily.     QUEtiapine (SEROQUEL) 25 MG tablet Take 1-2 tablets (25-50 mg total) by mouth at bedtime. 180 tablet 1   rosuvastatin (CRESTOR) 10 MG tablet Take 10 mg by mouth daily.     zolpidem (AMBIEN) 10 MG tablet Take 1 tablet (10 mg total) by mouth at bedtime as needed. 90 tablet 0   fexofenadine (ALLEGRA) 60 MG tablet Take 60 mg by mouth 2 (two) times daily. (Patient not taking: Reported on 08/20/2020)     No current facility-administered medications for this visit.    Medication Side Effects: Other: SSRI withdrawal  Allergies: No Known Allergies  Past Medical History:  Diagnosis Date   Chest pain    Chest pain    Depression    Dyslipidemia    Hyperlipidemia    Insomnia    Orthopnea    RA (rheumatoid arthritis) (HCC)    Shoulder bursitis    SOB (shortness of breath)     Family History  Problem Relation Age of Onset   Heart  disease Father     Social History   Socioeconomic History   Marital status: Divorced    Spouse name: Not on file   Number of children: Not on file   Years of education: Not on file   Highest education level: Not on file  Occupational History   Not on file  Tobacco Use   Smoking status: Never   Smokeless tobacco: Never  Vaping Use   Vaping Use: Never used  Substance and Sexual Activity   Alcohol use: No   Drug use: No   Sexual activity: Not on file  Other Topics Concern   Not on file  Social History Narrative   Not on file   Social Determinants of Health   Financial Resource Strain: Not on file  Food Insecurity: Not on file  Transportation Needs: Not on file  Physical Activity: Not on file  Stress: Not on  file  Social Connections: Not on file  Intimate Partner Violence: Not on file    Past Medical History, Surgical history, Social history, and Family history were reviewed and updated as appropriate.   Disability starts in June over RA.  Works PT  Please see review of systems for further details on the patient's review from today.   Objective:   Physical Exam:  BP 134/86   Pulse 87   Physical Exam Constitutional:      General: She is not in acute distress.    Appearance: She is well-developed.  Musculoskeletal:        General: No deformity.  Neurological:     Mental Status: She is alert and oriented to person, place, and time.     Motor: No tremor.     Coordination: Coordination normal.     Gait: Gait normal.  Psychiatric:        Attention and Perception: Attention normal. She is attentive.        Mood and Affect: Mood is anxious and depressed. Affect is not labile, blunt, angry or inappropriate.        Speech: Speech normal.        Behavior: Behavior normal.        Thought Content: Thought content normal. Thought content does not include homicidal or suicidal ideation. Thought content does not include homicidal or suicidal plan.        Cognition and  Memory: Cognition normal.        Judgment: Judgment normal.     Comments: Insight is good. 6/10 in evening on depression Anxiety related to situation with son in evenings 4/10    Lab Review:     Component Value Date/Time   NA 143 12/06/2011 2023   K 3.8 12/06/2011 2023   CL 106 12/06/2011 2023   CO2 31 11/10/2008 0850   GLUCOSE 123 (H) 12/06/2011 2023   BUN 14 12/06/2011 2023   CREATININE 1.00 12/06/2011 2023   CALCIUM 9.0 11/10/2008 0850   GFRNONAA >60 11/10/2008 0850   GFRAA  11/10/2008 0850    >60        The eGFR has been calculated using the MDRD equation. This calculation has not been validated in all clinical situations. eGFR's persistently <60 mL/min signify possible Chronic Kidney Disease.       Component Value Date/Time   WBC 10.4 12/06/2011 1950   RBC 4.61 12/06/2011 1950   HGB 13.6 12/06/2011 2023   HCT 40.0 12/06/2011 2023   PLT 291 12/06/2011 1950   MCV 85.7 12/06/2011 1950   MCH 29.5 12/06/2011 1950   MCHC 34.4 12/06/2011 1950   RDW 12.8 12/06/2011 1950   LYMPHSABS 2.2 12/06/2011 1950   MONOABS 0.5 12/06/2011 1950   EOSABS 0.2 12/06/2011 1950   BASOSABS 0.0 12/06/2011 1950    No results found for: POCLITH, LITHIUM   No results found for: PHENYTOIN, PHENOBARB, VALPROATE, CBMZ   .res Assessment: Plan:    Recurrent major depression in full remission [facility])  Social anxiety disorder  Attention deficit hyperactivity disorder (ADHD), combined type  Insomnia due to mental condition   Overall OK and satisfied with meds.    Disc rapid metabolism of  Fluoxetine and SSRI withdrawal.  They all do that to her.  She doesn't want to change anything.  If she could remember splitting the dose it would help but she forgets. Disc genetic testing options and the metabolism of fluoxetine by 2D6.  She thinks she did the testing  elsewhere and will loook for it.   Trintellix 5 mg daily in the AM and reduce the Prozac to 60 mg daily for aweek, Then increase  Trintellix to 10 mg daily and reduce Prozac to 40 mg daily for a week, Then increase Trintellix 10 +5 mg daily and reduce Prozac to 20 mg daily for a week, Then increase Trintellix to 20 mg daily and stop Prozac  OK prn ADDERALL for ADD and it helps anxiety for her too.  She plants to take it rarely.  Discussed potential benefits, risks, and side effects of stimulants with patient to include increased heart rate, palpitations, insomnia, increased anxiety, increased irritability, or decreased appetite.  Instructed patient to contact office if experiencing any significant tolerability issues.  Disc initial insomnia. 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  She has failed to be able to stop it. Seroquel 25 to 50 mg nightly. OK to continue zolpidem Disc SE and tachyphylaxis.  Disc withdrawal.   Patient is unlikely to experience antipsychotic side effects from Seroquel at this low-dose.  FU 2 mos  Lynder Parents, MD, DFAPA   Please see After Visit Summary for patient specific instructions.  No future appointments.  No orders of the defined types were placed in this encounter.     -------------------------------

## 2020-08-20 NOTE — Patient Instructions (Signed)
Trintellix 5 mg daily in the AM and reduce the Prozac to 60 mg daily for aweek, Then increase Trintellix to 10 mg daily and reduce Prozac to 40 mg daily for a week, Then increase Trintellix 10 +5 mg daily and reduce Prozac to 20 mg daily for a week, Then increase Trintellix to 20 mg daily and stop Prozac  [email or web address]

## 2020-08-23 ENCOUNTER — Telehealth: Payer: Self-pay | Admitting: Psychiatry

## 2020-08-23 ENCOUNTER — Other Ambulatory Visit: Payer: Self-pay | Admitting: Psychiatry

## 2020-08-23 DIAGNOSIS — F5105 Insomnia due to other mental disorder: Secondary | ICD-10-CM

## 2020-08-23 NOTE — Telephone Encounter (Signed)
Script sent  

## 2020-08-23 NOTE — Telephone Encounter (Signed)
Last filled 4/21 appt 9/27 Virginia Hayes please approve for Dr Jennelle Human

## 2020-08-23 NOTE — Telephone Encounter (Signed)
Pended please approve for Dr Jennelle Human

## 2020-08-23 NOTE — Telephone Encounter (Signed)
Pt called in for refill on Ambien 10mg . States she is out and won't have any for tonight.  Appt 9/27. Ph: [phone]. Pharmacy [facility] [phone] [place], [place]

## 2020-09-02 ENCOUNTER — Ambulatory Visit: Payer: Medicare HMO | Admitting: Psychiatry

## 2020-09-06 ENCOUNTER — Other Ambulatory Visit: Payer: Self-pay

## 2020-09-06 ENCOUNTER — Telehealth: Payer: Self-pay | Admitting: Psychiatry

## 2020-09-06 MED ORDER — VORTIOXETINE HBR 20 MG PO TABS: 20.0000 mg | ORAL_TABLET | Freq: Every day | ORAL | 0 refills | Status: DC

## 2020-09-06 NOTE — Telephone Encounter (Signed)
I have not received a PA yet but I can try to pull it up.

## 2020-09-06 NOTE — Telephone Encounter (Signed)
LVM for pt to call back and let us know how many mg she is on

## 2020-09-06 NOTE — Telephone Encounter (Signed)
Virginia Hayes has been on samples of Trintellix now and has about 5 days worth left. She would like it sent in to her pharmacy- [facility] in [place] on file. It may need a PA.

## 2020-09-06 NOTE — Telephone Encounter (Signed)
Trintellix 20 mg sent.Virginia Hayes will she need a PA?

## 2020-10-03 ENCOUNTER — Other Ambulatory Visit: Payer: Self-pay | Admitting: Psychiatry

## 2020-10-07 ENCOUNTER — Other Ambulatory Visit: Payer: Self-pay

## 2020-10-07 ENCOUNTER — Telehealth: Payer: Self-pay | Admitting: Psychiatry

## 2020-10-07 DIAGNOSIS — F902 Attention-deficit hyperactivity disorder, combined type: Secondary | ICD-10-CM

## 2020-10-07 MED ORDER — AMPHETAMINE-DEXTROAMPHETAMINE 20 MG PO TABS: 20.0000 mg | ORAL_TABLET | Freq: Two times a day (BID) | ORAL | 0 refills | Status: DC

## 2020-10-07 NOTE — Telephone Encounter (Signed)
Patient lm requesting a refill on the Adderall 90 day supply sent to [facility] [address]. Next appt 11/05/20

## 2020-10-07 NOTE — Telephone Encounter (Signed)
pended

## 2020-10-09 ENCOUNTER — Other Ambulatory Visit: Payer: Self-pay

## 2020-10-21 ENCOUNTER — Other Ambulatory Visit: Payer: Self-pay

## 2020-10-22 ENCOUNTER — Telehealth: Payer: Self-pay | Admitting: Psychiatry

## 2020-10-22 NOTE — Telephone Encounter (Signed)
LVM to return call.

## 2020-10-22 NOTE — Telephone Encounter (Signed)
Pt called and LVM stating that Dr. Jennelle Human changed her meds and she doesn't feel like they are working.  She would like to have a follow up visit with Dr. Jennelle Human before her next appt 9/27, but there's currently no openings.  Maybe you can call to see what the issues are to see if she needs to be put into one of his work-in spots.

## 2020-11-05 ENCOUNTER — Ambulatory Visit: Payer: Medicare HMO | Admitting: Psychiatry

## 2020-11-05 ENCOUNTER — Other Ambulatory Visit: Payer: Self-pay

## 2020-11-05 ENCOUNTER — Encounter: Payer: Self-pay | Admitting: Psychiatry

## 2020-11-05 VITALS — BP 146/102 | HR 114

## 2020-11-05 DIAGNOSIS — F902 Attention-deficit hyperactivity disorder, combined type: Secondary | ICD-10-CM | POA: Diagnosis not present

## 2020-11-05 DIAGNOSIS — F4010 Social phobia, unspecified: Secondary | ICD-10-CM

## 2020-11-05 DIAGNOSIS — F5105 Insomnia due to other mental disorder: Secondary | ICD-10-CM | POA: Diagnosis not present

## 2020-11-05 DIAGNOSIS — F3342 Major depressive disorder, recurrent, in full remission: Secondary | ICD-10-CM

## 2020-11-05 MED ORDER — FLUOXETINE HCL 20 MG PO CAPS
60.0000 mg | ORAL_CAPSULE | Freq: Every day | ORAL | 0 refills | Status: DC
Start: 2020-11-05 — End: 2021-02-06

## 2020-11-05 MED ORDER — ARIPIPRAZOLE 5 MG PO TABS: 5.0000 mg | ORAL_TABLET | Freq: Every day | ORAL | 1 refills | Status: DC

## 2020-11-05 NOTE — Progress Notes (Signed)
Virginia Hayes [id number] 05/16/1960 60 y.o.  Subjective:   Patient ID:  Virginia Hayes is a 60 y.o. (DOB 1960/04/01) female.  Chief Complaint:  Chief Complaint  Patient presents with   Follow-up   Depression    Med change   ADHD    Depression        Associated symptoms include no decreased concentration and no suicidal ideas. Medication Refill Associated symptoms include arthralgias. Pertinent negatives include no chest pain or weakness.  Virginia Hayes presents to the office today for follow-up of depression and anxiety and ADD.  07/2019 appt noted: I'm doing good.  Needs Adderall to work.   S/P shoulder and gall bladder surgery. Seems fluoxetine is less effective and seems to have low serotonin feeling about 18 hours after taking fluoxetine and notices more weepy and less joy.   Ambien at 11 pm and often awake until 3-4 AM.  Taking on empty stomach.  No caffeine after lunch.   Pretty stable with the Ambien.  Occ insomnia that doesn't work.  Can't sleep without Ambien.  Patient reports stable mood and denies depressed or irritable moods.  anxiety is a little worse.  anxiety re: son [name] and uses Xanax..  Patient denies difficulty with sleep initiation or maintenance. Denies appetite disturbance.  Patient reports that energy and motivation have been good.  Patient has difficulty with concentration.  Patient denies any suicidal ideation. Plan: Disc initial insomnia. 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   Seroquel 25 to 50 mg nightly. Taper off Zolpidem  03/11/2020 appointment noted: Doing fine back on zolpidem. Satisfied with meds.  Quetiapine 25 mg didn't work without Ambien but good with it.  Stays asleep without problems with quetiapine. Good with other meds. Patient reports stable mood and denies depressed or irritable moods.  Patient denies any recent difficulty with anxiety.  Patient denies difficulty with sleep initiation or maintenance. Denies appetite  disturbance.  Patient reports that energy and motivation have been good.  Patient denies any difficulty with concentration.  Patient denies any suicidal ideation. Son severe OCD and completely disabled.  A big stress.  He did better on higher risperidone.  But had weight gain.  Touching issues and tics.  Avoidant.  Minimizes sx.  Has episodes of explosive anger over his OCD.  Can rant for an hour and then be remorseful.  A lot of concerns about him. His severe OCD drives her anxiety and depression.  She won't place him.  His issues are her issues. Gets withdrawal swimmy headed ness for hours before the next dose is due.  Adderall will help mood and social anxiety and motivation and wants to take it again.  Sometimes sits on the couch all day long.  08/20/20 appt noted: Retired.  Enjoys [employer].  Tough dealing with [name] who has bad OCD and anger outbursts.  He needs counseling.  His sx worsen her sx. Thinks prozac works but maybe too well. Plan: Trintellix 5 mg daily in the AM and reduce the Prozac to 60 mg daily for aweek, Then increase Trintellix to 10 mg daily and reduce Prozac to 40 mg daily for a week, Then increase Trintellix 10 +5 mg daily and reduce Prozac to 20 mg daily for a week, Then increase Trintellix to 20 mg daily and stop Prozac Disc initial insomnia. 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  She has failed to be able to stop it. Seroquel 25 to 50 mg nightly. OK to continue  zolpidem  11/05/20 appt noted : Rough time with transition.  Rough with withdrawal.  Crying.  Couldn't take it.  Added prozac 40 mg daily and dropped Trintellix to 10 mg daily.  Still feels flat.  Seems like sleep is worse.  Chronically foggy and buzzy feeling in her head.  Feels better with Prozac 80 than now. No severe nausea with Trintellix.    Past Psychiatric Medication Trials:  Trazodone NR, Xanax hangover,  Zoloft, paroxetine, Wellbutrin, Lexapro, Prozac, Trintellix  Review of Systems:   Review of Systems  Cardiovascular:  Negative for chest pain and palpitations.  Musculoskeletal:  Positive for arthralgias.  Neurological:  Negative for tremors and weakness.  Psychiatric/Behavioral:  Negative for agitation, behavioral problems, confusion, decreased concentration, dysphoric mood, hallucinations, self-injury, sleep disturbance and suicidal ideas. The patient is nervous/anxious. The patient is not hyperactive.    Medications: I have reviewed the patient's current medications.  Current Outpatient Medications  Medication Sig Dispense Refill   ALPRAZolam (XANAX) 0.5 MG tablet TAKE 1 TABLET BY MOUTH AT BEDTIME AS NEEDED FOR ANXIETY. 30 tablet 1   amphetamine-dextroamphetamine (ADDERALL) 20 MG tablet Take 1 tablet (20 mg total) by mouth 2 (two) times daily. 60 tablet 0   amphetamine-dextroamphetamine (ADDERALL) 20 MG tablet Take 1 tablet (20 mg total) by mouth 2 (two) times daily. 60 tablet 0   amphetamine-dextroamphetamine (ADDERALL) 20 MG tablet Take 1 tablet (20 mg total) by mouth 2 (two) times daily. 60 tablet 0   azelastine (ASTELIN) 0.1 % nasal spray Place 2 sprays into both nostrils 2 (two) times daily. Use in each nostril as directed 30 mL 5   Cholecalciferol (D 5000) 125 MCG (5000 UT) capsule Take by mouth.     etanercept (ENBREL) 50 MG/ML injection Inject 50 mg into the skin once a week.     fexofenadine (ALLEGRA) 60 MG tablet Take 60 mg by mouth 2 (two) times daily.     FLUoxetine (PROZAC) 40 MG capsule Take 2 capsules (80 mg total) by mouth daily. (Patient taking differently: Take 40 mg by mouth daily.) 180 capsule 1   fluticasone (FLONASE) 50 MCG/ACT nasal spray Place 1 spray into both nostrils daily as needed for allergies or rhinitis. 18.2 mL 5   meloxicam (MOBIC) 15 MG tablet TAKE ONE TABLET (15 MG DOSE) BY MOUTH DAILY.     pantoprazole (PROTONIX) 20 MG tablet Take 20 mg by mouth daily.     QUEtiapine (SEROQUEL) 25 MG tablet Take 1-2 tablets (25-50 mg total) by mouth  at bedtime. 180 tablet 1   rosuvastatin (CRESTOR) 10 MG tablet Take 10 mg by mouth daily.     TRINTELLIX 20 MG TABS tablet TAKE 1 TABLET BY MOUTH EVERY DAY 30 tablet 0   zolpidem (AMBIEN) 10 MG tablet TAKE 1 TABLET BY MOUTH AT BEDTIME AS NEEDED. 90 tablet 0   levocetirizine (XYZAL) 5 MG tablet TAKE 1 TABLET (5 MG TOTAL) BY MOUTH DAILY AS NEEDED FOR ALLERGIES. (Patient not taking: Reported on 11/05/2020) 90 tablet 1   No current facility-administered medications for this visit.    Medication Side Effects: Other: SSRI withdrawal  Allergies: No Known Allergies  Past Medical History:  Diagnosis Date   Chest pain    Chest pain    Depression    Dyslipidemia    Hyperlipidemia    Insomnia    Orthopnea    RA (rheumatoid arthritis) (HCC)    Shoulder bursitis    SOB (shortness of breath)     Family History  Problem Relation Age of Onset   Heart disease Father     Social History   Socioeconomic History   Marital status: Divorced    Spouse name: Not on file   Number of children: Not on file   Years of education: Not on file   Highest education level: Not on file  Occupational History   Not on file  Tobacco Use   Smoking status: Never   Smokeless tobacco: Never  Vaping Use   Vaping Use: Never used  Substance and Sexual Activity   Alcohol use: No   Drug use: No   Sexual activity: Not on file  Other Topics Concern   Not on file  Social History Narrative   Not on file   Social Determinants of Health   Financial Resource Strain: Not on file  Food Insecurity: Not on file  Transportation Needs: Not on file  Physical Activity: Not on file  Stress: Not on file  Social Connections: Not on file  Intimate Partner Violence: Not on file    Past Medical History, Surgical history, Social history, and Family history were reviewed and updated as appropriate.   Disability starts in June over RA.  Works PT  Please see review of systems for further details on the patient's review  from today.   Objective:   Physical Exam:  BP (!) 146/102   Pulse (!) 114   Physical Exam Constitutional:      General: She is not in acute distress.    Appearance: She is well-developed.  Musculoskeletal:        General: No deformity.  Neurological:     Mental Status: She is alert and oriented to person, place, and time.     Motor: No tremor.     Coordination: Coordination normal.     Gait: Gait normal.  Psychiatric:        Attention and Perception: Attention normal. She is attentive.        Mood and Affect: Mood is anxious and depressed. Affect is not labile, blunt, angry or inappropriate.        Speech: Speech normal.        Behavior: Behavior normal.        Thought Content: Thought content normal. Thought content does not include homicidal or suicidal ideation. Thought content does not include homicidal or suicidal plan.        Cognition and Memory: Cognition normal.        Judgment: Judgment normal.     Comments: Insight is good. 6/10 in evening on depression Anxiety related to situation with son in evenings 4/10    Lab Review:     Component Value Date/Time   NA 143 12/06/2011 2023   K 3.8 12/06/2011 2023   CL 106 12/06/2011 2023   CO2 31 11/10/2008 0850   GLUCOSE 123 (H) 12/06/2011 2023   BUN 14 12/06/2011 2023   CREATININE 1.00 12/06/2011 2023   CALCIUM 9.0 11/10/2008 0850   GFRNONAA >60 11/10/2008 0850   GFRAA  11/10/2008 0850    >60        The eGFR has been calculated using the MDRD equation. This calculation has not been validated in all clinical situations. eGFR's persistently <60 mL/min signify possible Chronic Kidney Disease.       Component Value Date/Time   WBC 10.4 12/06/2011 1950   RBC 4.61 12/06/2011 1950   HGB 13.6 12/06/2011 2023   HCT 40.0 12/06/2011 2023   PLT 291 12/06/2011 1950  MCV 85.7 12/06/2011 1950   MCH 29.5 12/06/2011 1950   MCHC 34.4 12/06/2011 1950   RDW 12.8 12/06/2011 1950   LYMPHSABS 2.2 12/06/2011 1950   MONOABS  0.5 12/06/2011 1950   EOSABS 0.2 12/06/2011 1950   BASOSABS 0.0 12/06/2011 1950    No results found for: POCLITH, LITHIUM   No results found for: PHENYTOIN, PHENOBARB, VALPROATE, CBMZ   .res Assessment: Plan:    Recurrent major depression in full remission [facility])  Social anxiety disorder  Attention deficit hyperactivity disorder (ADHD), combined type  Insomnia due to mental condition   Greater than 50% of face to face time with patient was spent on counseling and coordination of care. We discussed her difficulty with transition to Trintellix with experience of greater depression and a feeling of withdrawal from serotonin.  We discussed alternatives.  She felt better on Prozac 80 mg but was somewhat flat.  We discussed partial dopamine agonist at length and their side effects.  She agrees with a trial of Abilify added to a lower dose of Prozac 60 mg Discussed potential metabolic side effects associated with atypical antipsychotics, as well as potential risk for movement side effects. Advised pt to contact office if movement side effects occur.   Abilify 2.5 mg daily for 1 week and if NR then 5 mg daily. DC Trintellix and increase to 60 Prozac  OK prn ADDERALL for ADD and it helps anxiety for her too.  She plants to take it rarely.  Discussed potential benefits, risks, and side effects of stimulants with patient to include increased heart rate, palpitations, insomnia, increased anxiety, increased irritability, or decreased appetite.  Instructed patient to contact office if experiencing any significant tolerability issues.  Disc initial insomnia. 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  She has failed to be able to stop it. Seroquel 25 to 50 mg nightly. OK to continue zolpidem Disc SE and tachyphylaxis.  Disc withdrawal.   Patient is unlikely to experience antipsychotic side effects from Seroquel at this low-dose.  FU 2 mos  Lynder Parents, MD, DFAPA   Please see After  Visit Summary for patient specific instructions.  No future appointments.  No orders of the defined types were placed in this encounter.     -------------------------------

## 2020-11-18 ENCOUNTER — Telehealth: Payer: Self-pay | Admitting: Psychiatry

## 2020-11-18 ENCOUNTER — Other Ambulatory Visit: Payer: Self-pay

## 2020-11-18 ENCOUNTER — Other Ambulatory Visit: Payer: Self-pay | Admitting: Psychiatry

## 2020-11-18 DIAGNOSIS — F5105 Insomnia due to other mental disorder: Secondary | ICD-10-CM

## 2020-11-18 MED ORDER — ZOLPIDEM TARTRATE 10 MG PO TABS: 10.0000 mg | ORAL_TABLET | Freq: Every evening | ORAL | 0 refills | Status: DC | PRN

## 2020-11-18 NOTE — Telephone Encounter (Signed)
Sent prescription to [facility] 10 mg tablets #3 with the message that it was okay for early refill.

## 2020-11-18 NOTE — Telephone Encounter (Signed)
Patient called back to thank office for refilling the Ambien, unfortunately she will need a Rx for 2 or 3 tabs. The pharmacy will not let her fill the Rx submitted until 10/14. She only has medication for tonight and tomorrow. She tried to explain to the pharmacy there were a couple of months with 31 days.

## 2020-11-18 NOTE — Telephone Encounter (Signed)
This was filled on 08/27/20 for 90 days and she is saying she will be out before 10/14.The rx was sent as Take 1 tablet (10 mg total) by mouth at bedtime as needed #90

## 2020-11-18 NOTE — Telephone Encounter (Signed)
Pt LVM stating she would like a refill on Ambien.  [id number], [place] - [address]  [address], [place] [address]  Phone:  [phone]  Fax:  [phone]   Next appt 12/5

## 2020-11-18 NOTE — Telephone Encounter (Signed)
pended

## 2020-11-29 ENCOUNTER — Other Ambulatory Visit: Payer: Self-pay | Admitting: Psychiatry

## 2020-11-29 DIAGNOSIS — F4010 Social phobia, unspecified: Secondary | ICD-10-CM

## 2020-11-29 DIAGNOSIS — F3342 Major depressive disorder, recurrent, in full remission: Secondary | ICD-10-CM

## 2020-12-11 ENCOUNTER — Other Ambulatory Visit: Payer: Self-pay | Admitting: Psychiatry

## 2020-12-11 DIAGNOSIS — F902 Attention-deficit hyperactivity disorder, combined type: Secondary | ICD-10-CM

## 2020-12-11 NOTE — Telephone Encounter (Signed)
Patient Virginia Hayes requesting a refill on the Adderall. She is requesting her 90 day supply. [phone] - [place], [place] - [address]  [address], [place] [address]  Phone:  [phone]  Fax:  [phone]. Last seen in office on 9/27 with a follow up on 12/15.

## 2020-12-11 NOTE — Telephone Encounter (Signed)
Pt has appt on 12/5 and is requesting a 90 day refill.I told her you may not approve due to her appt being next month.She said she normally gets 90 days and not 3 separate rx

## 2020-12-17 ENCOUNTER — Other Ambulatory Visit: Payer: Self-pay | Admitting: Psychiatry

## 2020-12-17 DIAGNOSIS — F902 Attention-deficit hyperactivity disorder, combined type: Secondary | ICD-10-CM

## 2020-12-17 MED ORDER — AMPHETAMINE-DEXTROAMPHETAMINE 20 MG PO TABS: 20.0000 mg | ORAL_TABLET | Freq: Two times a day (BID) | ORAL | 0 refills | Status: DC

## 2020-12-17 NOTE — Telephone Encounter (Signed)
Pt LVM regarding status of refill.  She is out of meds.  Next appt 12/5

## 2020-12-17 NOTE — Telephone Encounter (Signed)
Patient wanting refill on Adderall, states she usually gets 90-day supply. Per controlled database and history if Epic she is only getting #60 for a 30-day supply of Adderall, but is getting 90-day supply of Ambien. Called her to let her know and she agreed that she is only getting a month supply of Adderall.  Told her I would send request to provider.  Last filled 8/29.

## 2020-12-17 NOTE — Telephone Encounter (Signed)
FYI.  Sent 2 x 30-day prescriptions for Adderall

## 2021-01-13 ENCOUNTER — Ambulatory Visit: Payer: Medicare HMO | Admitting: Psychiatry

## 2021-02-02 ENCOUNTER — Other Ambulatory Visit: Payer: Self-pay | Admitting: Psychiatry

## 2021-02-02 DIAGNOSIS — F4010 Social phobia, unspecified: Secondary | ICD-10-CM

## 2021-02-02 DIAGNOSIS — F3342 Major depressive disorder, recurrent, in full remission: Secondary | ICD-10-CM

## 2021-02-06 NOTE — Telephone Encounter (Signed)
Please schedule appt

## 2021-02-06 NOTE — Telephone Encounter (Signed)
Pt is scheduled 2/14

## 2021-02-06 NOTE — Telephone Encounter (Signed)
Last seen 9/27 due back 11/27

## 2021-02-09 ENCOUNTER — Other Ambulatory Visit: Payer: Self-pay | Admitting: Psychiatry

## 2021-02-09 DIAGNOSIS — F5105 Insomnia due to other mental disorder: Secondary | ICD-10-CM

## 2021-02-15 ENCOUNTER — Other Ambulatory Visit: Payer: Self-pay | Admitting: Psychiatry

## 2021-02-15 DIAGNOSIS — F5105 Insomnia due to other mental disorder: Secondary | ICD-10-CM

## 2021-02-17 NOTE — Telephone Encounter (Signed)
Pt requesting Rx for Ambien @ [facility]. Apt 2/14

## 2021-02-27 ENCOUNTER — Other Ambulatory Visit: Payer: Self-pay

## 2021-02-27 ENCOUNTER — Encounter: Payer: Self-pay | Admitting: Psychiatry

## 2021-02-27 ENCOUNTER — Ambulatory Visit (INDEPENDENT_AMBULATORY_CARE_PROVIDER_SITE_OTHER): Payer: Medicare Other | Admitting: Psychiatry

## 2021-02-27 VITALS — BP 133/83 | HR 122

## 2021-02-27 DIAGNOSIS — F4010 Social phobia, unspecified: Secondary | ICD-10-CM | POA: Diagnosis not present

## 2021-02-27 DIAGNOSIS — F3342 Major depressive disorder, recurrent, in full remission: Secondary | ICD-10-CM

## 2021-02-27 DIAGNOSIS — F5105 Insomnia due to other mental disorder: Secondary | ICD-10-CM | POA: Diagnosis not present

## 2021-02-27 DIAGNOSIS — F902 Attention-deficit hyperactivity disorder, combined type: Secondary | ICD-10-CM | POA: Diagnosis not present

## 2021-02-27 MED ORDER — AMPHETAMINE-DEXTROAMPHETAMINE 20 MG PO TABS: 20.0000 mg | ORAL_TABLET | Freq: Two times a day (BID) | ORAL | 0 refills | Status: DC

## 2021-02-27 MED ORDER — FLUOXETINE HCL 20 MG PO CAPS: 60.0000 mg | ORAL_CAPSULE | Freq: Every day | ORAL | 1 refills | Status: DC

## 2021-02-27 MED ORDER — ARIPIPRAZOLE 5 MG PO TABS: 5.0000 mg | ORAL_TABLET | Freq: Every day | ORAL | 0 refills | Status: DC

## 2021-02-27 NOTE — Progress Notes (Signed)
Virginia Hayes [id number] 1960-02-22 61 y.o.  Subjective:   Patient ID:  Virginia Hayes is a 61 y.o. (DOB 1960-03-22) female.  Chief Complaint:  Chief Complaint  Patient presents with   Follow-up   Depression   ADHD    Depression        Associated symptoms include no decreased concentration and no suicidal ideas. Medication Refill Associated symptoms include arthralgias. Pertinent negatives include no chest pain or weakness.  Virginia Hayes presents to the office today for follow-up of depression and anxiety and ADD.  07/2019 appt noted: I'm doing good.  Needs Adderall to work.   S/P shoulder and gall bladder surgery. Seems fluoxetine is less effective and seems to have low serotonin feeling about 18 hours after taking fluoxetine and notices more weepy and less joy.   Ambien at 11 pm and often awake until 3-4 AM.  Taking on empty stomach.  No caffeine after lunch.   Pretty stable with the Ambien.  Occ insomnia that doesn't work.  Can't sleep without Ambien.  Patient reports stable mood and denies depressed or irritable moods.  anxiety is a little worse.  anxiety re: son [name] and uses Xanax..  Patient denies difficulty with sleep initiation or maintenance. Denies appetite disturbance.  Patient reports that energy and motivation have been good.  Patient has difficulty with concentration.  Patient denies any suicidal ideation. Plan: Disc initial insomnia. 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   Seroquel 25 to 50 mg nightly. Taper off Zolpidem  03/11/2020 appointment noted: Doing fine back on zolpidem. Satisfied with meds.  Quetiapine 25 mg didn't work without Ambien but good with it.  Stays asleep without problems with quetiapine. Good with other meds. Patient reports stable mood and denies depressed or irritable moods.  Patient denies any recent difficulty with anxiety.  Patient denies difficulty with sleep initiation or maintenance. Denies appetite disturbance.   Patient reports that energy and motivation have been good.  Patient denies any difficulty with concentration.  Patient denies any suicidal ideation. Son severe OCD and completely disabled.  A big stress.  He did better on higher risperidone.  But had weight gain.  Touching issues and tics.  Avoidant.  Minimizes sx.  Has episodes of explosive anger over his OCD.  Can rant for an hour and then be remorseful.  A lot of concerns about him. His severe OCD drives her anxiety and depression.  She won't place him.  His issues are her issues. Gets withdrawal swimmy headed ness for hours before the next dose is due.  Adderall will help mood and social anxiety and motivation and wants to take it again.  Sometimes sits on the couch all day long.  08/20/20 appt noted: Retired.  Enjoys [employer].  Tough dealing with [name] who has bad OCD and anger outbursts.  He needs counseling.  His sx worsen her sx. Thinks prozac works but maybe too well. Plan: Trintellix 5 mg daily in the AM and reduce the Prozac to 60 mg daily for aweek, Then increase Trintellix to 10 mg daily and reduce Prozac to 40 mg daily for a week, Then increase Trintellix 10 +5 mg daily and reduce Prozac to 20 mg daily for a week, Then increase Trintellix to 20 mg daily and stop Prozac Disc initial insomnia. 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  She has failed to be able to stop it. Seroquel 25 to 50 mg nightly. OK to continue zolpidem  11/05/20 appt noted :  Rough time with transition.  Rough with withdrawal.  Crying.  Couldn't take it.  Added prozac 40 mg daily and dropped Trintellix to 10 mg daily.  Still feels flat.  Seems like sleep is worse.  Chronically foggy and buzzy feeling in her head.  Feels better with Prozac 80 than now. No severe nausea with Trintellix. Plan: Abilify 2.5 mg daily for 1 week and if NR then 5 mg daily. DC Trintellix and increase to 60 Prozac  02/27/2021 appointment with the following noted: Covid second  time. Abilify 2.5 mg is wonderful with all the difference. So much better than without it. Adderall helps mood too. Split Prozac and no withdrawal. Happier and more energy.  Noticed it quickly. Knee surgery.  Sleep is ok with Seroquel and Ambien   Past Psychiatric Medication Trials:  Trazodone NR, Xanax hangover,  Zoloft, paroxetine, Wellbutrin, Lexapro, Prozac, Trintellix  Review of Systems:  Review of Systems  Cardiovascular:  Negative for chest pain and palpitations.  Musculoskeletal:  Positive for arthralgias.  Neurological:  Negative for tremors and weakness.  Psychiatric/Behavioral:  Negative for agitation, behavioral problems, confusion, decreased concentration, dysphoric mood, hallucinations, self-injury, sleep disturbance and suicidal ideas. The patient is nervous/anxious. The patient is not hyperactive.    Medications: I have reviewed the patient's current medications.  Current Outpatient Medications  Medication Sig Dispense Refill   ALPRAZolam (XANAX) 0.5 MG tablet TAKE 1 TABLET BY MOUTH AT BEDTIME AS NEEDED FOR ANXIETY. 30 tablet 1   azelastine (ASTELIN) 0.1 % nasal spray Place 2 sprays into both nostrils 2 (two) times daily. Use in each nostril as directed 30 mL 5   Cholecalciferol (D 5000) 125 MCG (5000 UT) capsule Take by mouth.     etanercept (ENBREL) 50 MG/ML injection Inject 50 mg into the skin once a week.     fexofenadine (ALLEGRA) 60 MG tablet Take 60 mg by mouth 2 (two) times daily.     fluticasone (FLONASE) 50 MCG/ACT nasal spray Place 1 spray into both nostrils daily as needed for allergies or rhinitis. 18.2 mL 5   pantoprazole (PROTONIX) 20 MG tablet Take 20 mg by mouth daily.     QUEtiapine (SEROQUEL) 25 MG tablet TAKE 1-2 TABLETS (25-50 MG TOTAL) BY MOUTH AT BEDTIME. 180 tablet 0   rosuvastatin (CRESTOR) 10 MG tablet Take 10 mg by mouth daily.     zolpidem (AMBIEN) 10 MG tablet TAKE 1 TABLET BY MOUTH AT BEDTIME AS NEEDED. 90 tablet 0   [START ON 04/24/2021]  amphetamine-dextroamphetamine (ADDERALL) 20 MG tablet Take 1 tablet (20 mg total) by mouth 2 (two) times daily. 60 tablet 0   [START ON 03/27/2021] amphetamine-dextroamphetamine (ADDERALL) 20 MG tablet Take 1 tablet (20 mg total) by mouth 2 (two) times daily. 60 tablet 0   amphetamine-dextroamphetamine (ADDERALL) 20 MG tablet Take 1 tablet (20 mg total) by mouth 2 (two) times daily. 60 tablet 0   ARIPiprazole (ABILIFY) 5 MG tablet Take 1 tablet (5 mg total) by mouth daily. 90 tablet 0   FLUoxetine (PROZAC) 20 MG capsule Take 3 capsules (60 mg total) by mouth daily. 270 capsule 1   levocetirizine (XYZAL) 5 MG tablet TAKE 1 TABLET (5 MG TOTAL) BY MOUTH DAILY AS NEEDED FOR ALLERGIES. (Patient not taking: Reported on 11/05/2020) 90 tablet 1   meloxicam (MOBIC) 15 MG tablet TAKE ONE TABLET (15 MG DOSE) BY MOUTH DAILY. (Patient not taking: Reported on 02/27/2021)     No current facility-administered medications for this visit.  Medication Side Effects: Other: SSRI withdrawal  Allergies: No Known Allergies  Past Medical History:  Diagnosis Date   Chest pain    Chest pain    Depression    Dyslipidemia    Hyperlipidemia    Insomnia    Orthopnea    RA (rheumatoid arthritis) (HCC)    Shoulder bursitis    SOB (shortness of breath)     Family History  Problem Relation Age of Onset   Heart disease Father     Social History   Socioeconomic History   Marital status: Divorced    Spouse name: Not on file   Number of children: Not on file   Years of education: Not on file   Highest education level: Not on file  Occupational History   Not on file  Tobacco Use   Smoking status: Never   Smokeless tobacco: Never  Vaping Use   Vaping Use: Never used  Substance and Sexual Activity   Alcohol use: No   Drug use: No   Sexual activity: Not on file  Other Topics Concern   Not on file  Social History Narrative   Not on file   Social Determinants of Health   Financial Resource Strain: Not  on file  Food Insecurity: Not on file  Transportation Needs: Not on file  Physical Activity: Not on file  Stress: Not on file  Social Connections: Not on file  Intimate Partner Violence: Not on file    Past Medical History, Surgical history, Social history, and Family history were reviewed and updated as appropriate.   Disability starts in June over RA.  Works PT  Please see review of systems for further details on the patient's review from today.   Objective:   Physical Exam:  BP 133/83    Pulse (!) 122   Physical Exam Constitutional:      General: She is not in acute distress.    Appearance: She is well-developed.  Musculoskeletal:        General: No deformity.  Neurological:     Mental Status: She is alert and oriented to person, place, and time.     Motor: No tremor.     Coordination: Coordination normal.     Gait: Gait normal.  Psychiatric:        Attention and Perception: Attention normal. She is attentive.        Mood and Affect: Mood is anxious. Mood is not depressed. Affect is not labile, blunt, angry or inappropriate.        Speech: Speech normal.        Behavior: Behavior normal.        Thought Content: Thought content normal. Thought content is not delusional. Thought content does not include homicidal or suicidal ideation. Thought content does not include suicidal plan.        Cognition and Memory: Cognition normal.        Judgment: Judgment normal.     Comments: Insight is good.     Lab Review:     Component Value Date/Time   NA 143 12/06/2011 2023   K 3.8 12/06/2011 2023   CL 106 12/06/2011 2023   CO2 31 11/10/2008 0850   GLUCOSE 123 (H) 12/06/2011 2023   BUN 14 12/06/2011 2023   CREATININE 1.00 12/06/2011 2023   CALCIUM 9.0 11/10/2008 0850   GFRNONAA >60 11/10/2008 0850   GFRAA  11/10/2008 0850    >60        The eGFR has  been calculated using the MDRD equation. This calculation has not been validated in all clinical situations. eGFR's  persistently <60 mL/min signify possible Chronic Kidney Disease.       Component Value Date/Time   WBC 10.4 12/06/2011 1950   RBC 4.61 12/06/2011 1950   HGB 13.6 12/06/2011 2023   HCT 40.0 12/06/2011 2023   PLT 291 12/06/2011 1950   MCV 85.7 12/06/2011 1950   MCH 29.5 12/06/2011 1950   MCHC 34.4 12/06/2011 1950   RDW 12.8 12/06/2011 1950   LYMPHSABS 2.2 12/06/2011 1950   MONOABS 0.5 12/06/2011 1950   EOSABS 0.2 12/06/2011 1950   BASOSABS 0.0 12/06/2011 1950    No results found for: POCLITH, LITHIUM   No results found for: PHENYTOIN, PHENOBARB, VALPROATE, CBMZ   .res Assessment: Plan:    Recurrent major depression in full remission ([place]) - Plan: ARIPiprazole (ABILIFY) 5 MG tablet, FLUoxetine (PROZAC) 20 MG capsule  Social anxiety disorder - Plan: ARIPiprazole (ABILIFY) 5 MG tablet, FLUoxetine (PROZAC) 20 MG capsule  Insomnia due to mental condition  Attention deficit hyperactivity disorder (ADHD), combined type - Plan: amphetamine-dextroamphetamine (ADDERALL) 20 MG tablet, amphetamine-dextroamphetamine (ADDERALL) 20 MG tablet, amphetamine-dextroamphetamine (ADDERALL) 20 MG tablet   Greater than 50% of face to face time with patient was spent on counseling and coordination of care. We discussed her difficulty with transition to Trintellix with experience of greater depression and a feeling of withdrawal from serotonin.  We discussed alternatives.  She felt better on Prozac 80 mg but was somewhat flat.  We discussed partial dopamine agonist at length and their side effects.  She agrees with a trial of Abilify added to a lower dose of Prozac 60 mg Discussed potential metabolic side effects associated with atypical antipsychotics, as well as potential risk for movement side effects. Advised pt to contact office if movement side effects occur.   Abilify 2.5 mg daily  continue 60 Prozac  OK prn ADDERALL for ADD and it helps anxiety for her too.  She plants to take it rarely.   Discussed potential benefits, risks, and side effects of stimulants with patient to include increased heart rate, palpitations, insomnia, increased anxiety, increased irritability, or decreased appetite.  Instructed patient to contact office if experiencing any significant tolerability issues.  Disc initial insomnia. 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  She has failed to be able to stop it. Seroquel 25 to 50 mg nightly. OK to continue zolpidem or try gradual taper Disc SE and tachyphylaxis.  Disc withdrawal.   Patient is unlikely to experience antipsychotic side effects from Seroquel at this low-dose.  FU 4-6 mos  Lynder Parents, MD, DFAPA   Please see After Visit Summary for patient specific instructions.  No future appointments.  No orders of the defined types were placed in this encounter.      -------------------------------

## 2021-03-25 ENCOUNTER — Ambulatory Visit: Payer: Medicare HMO | Admitting: Psychiatry

## 2021-04-14 ENCOUNTER — Other Ambulatory Visit: Payer: Self-pay

## 2021-04-16 ENCOUNTER — Other Ambulatory Visit: Payer: Self-pay

## 2021-05-08 ENCOUNTER — Other Ambulatory Visit: Payer: Self-pay | Admitting: Psychiatry

## 2021-05-08 ENCOUNTER — Telehealth: Payer: Self-pay | Admitting: Psychiatry

## 2021-05-08 DIAGNOSIS — F902 Attention-deficit hyperactivity disorder, combined type: Secondary | ICD-10-CM

## 2021-05-08 MED ORDER — AMPHETAMINE-DEXTROAMPHETAMINE 20 MG PO TABS: 20.0000 mg | ORAL_TABLET | Freq: Two times a day (BID) | ORAL | 0 refills | Status: DC

## 2021-05-08 NOTE — Telephone Encounter (Signed)
Next visit is 08/28/21. Virginia Hayes called and is requesting a refill on her Adderall 20 mg. [facility] does have it in stock. Pharmacy is: ? ?[facility], [phone]  [address] of [address], [place]. Phone number is [phone]. ? ? ?

## 2021-05-08 NOTE — Telephone Encounter (Signed)
Sent!

## 2021-05-12 ENCOUNTER — Other Ambulatory Visit: Payer: Self-pay

## 2021-05-14 ENCOUNTER — Other Ambulatory Visit: Payer: Self-pay

## 2021-05-14 ENCOUNTER — Telehealth: Payer: Self-pay | Admitting: Psychiatry

## 2021-05-14 DIAGNOSIS — F5105 Insomnia due to other mental disorder: Secondary | ICD-10-CM

## 2021-05-15 ENCOUNTER — Other Ambulatory Visit: Payer: Self-pay | Admitting: Psychiatry

## 2021-05-15 DIAGNOSIS — F5105 Insomnia due to other mental disorder: Secondary | ICD-10-CM

## 2021-05-15 DIAGNOSIS — F3342 Major depressive disorder, recurrent, in full remission: Secondary | ICD-10-CM

## 2021-05-15 DIAGNOSIS — F4010 Social phobia, unspecified: Secondary | ICD-10-CM

## 2021-05-15 MED ORDER — ZOLPIDEM TARTRATE 10 MG PO TABS: 10.0000 mg | ORAL_TABLET | Freq: Every evening | ORAL | 0 refills | Status: DC | PRN

## 2021-05-15 NOTE — Telephone Encounter (Signed)
Pt LVM at 11:07a.  She is out of Ambien 10 mg as of this Sat.  She would like the 90 day script sent to [facility] [address] in [place].  ? ?Next appt 7/20 ?

## 2021-07-01 ENCOUNTER — Telehealth: Payer: Self-pay | Admitting: Psychiatry

## 2021-07-01 NOTE — Telephone Encounter (Signed)
Error

## 2021-08-05 ENCOUNTER — Telehealth: Payer: Self-pay | Admitting: Psychiatry

## 2021-08-06 ENCOUNTER — Other Ambulatory Visit: Payer: Self-pay

## 2021-08-06 DIAGNOSIS — F5105 Insomnia due to other mental disorder: Secondary | ICD-10-CM

## 2021-08-06 MED ORDER — ZOLPIDEM TARTRATE 10 MG PO TABS: 10.0000 mg | ORAL_TABLET | Freq: Every evening | ORAL | 0 refills | Status: DC | PRN

## 2021-08-06 NOTE — Telephone Encounter (Signed)
LVM to RC. ? What date she will run out.

## 2021-08-06 NOTE — Telephone Encounter (Signed)
She last filled zolpidem 10 mg tablets #90 on 05/15/2021.  So she has not taken extra on a frequent basis so I will okay the early refill.  However please tell her that if she is having problems with nausea and vomiting, put the zolpidem under her tongue and let it dissolve.  It would not only work more quickly but she can avoid the problem with any issues of vomiting the tablet up.  It is not safe to take more than 1 tablet nightly because of the high risk of amnesia in women who take high doses

## 2021-08-06 NOTE — Telephone Encounter (Signed)
Pended.

## 2021-08-09 ENCOUNTER — Other Ambulatory Visit: Payer: Self-pay | Admitting: Psychiatry

## 2021-08-09 DIAGNOSIS — F5105 Insomnia due to other mental disorder: Secondary | ICD-10-CM

## 2021-08-12 ENCOUNTER — Other Ambulatory Visit: Payer: Self-pay | Admitting: Psychiatry

## 2021-08-12 DIAGNOSIS — F4010 Social phobia, unspecified: Secondary | ICD-10-CM

## 2021-08-12 DIAGNOSIS — F3342 Major depressive disorder, recurrent, in full remission: Secondary | ICD-10-CM

## 2021-08-13 NOTE — Telephone Encounter (Signed)
LVM to RC. ? Abilify dose. Med list shows 5 mg, note from 1/23 shows 2.5 mg.

## 2021-08-14 NOTE — Telephone Encounter (Signed)
Patient said she is no longer taking.

## 2021-08-28 ENCOUNTER — Ambulatory Visit (INDEPENDENT_AMBULATORY_CARE_PROVIDER_SITE_OTHER): Payer: Medicare Other | Admitting: Psychiatry

## 2021-08-28 ENCOUNTER — Encounter: Payer: Self-pay | Admitting: Psychiatry

## 2021-08-28 VITALS — BP 138/97 | HR 103

## 2021-08-28 DIAGNOSIS — F902 Attention-deficit hyperactivity disorder, combined type: Secondary | ICD-10-CM

## 2021-08-28 DIAGNOSIS — F4010 Social phobia, unspecified: Secondary | ICD-10-CM

## 2021-08-28 DIAGNOSIS — F5105 Insomnia due to other mental disorder: Secondary | ICD-10-CM

## 2021-08-28 DIAGNOSIS — F3342 Major depressive disorder, recurrent, in full remission: Secondary | ICD-10-CM | POA: Diagnosis not present

## 2021-08-28 MED ORDER — FLUOXETINE HCL 20 MG PO CAPS: 60.0000 mg | ORAL_CAPSULE | Freq: Every day | ORAL | 1 refills | Status: DC

## 2021-08-28 MED ORDER — QUETIAPINE FUMARATE 25 MG PO TABS: 50.0000 mg | ORAL_TABLET | Freq: Every day | ORAL | 0 refills | Status: DC

## 2021-08-28 MED ORDER — LISDEXAMFETAMINE DIMESYLATE 70 MG PO CAPS
70.0000 mg | ORAL_CAPSULE | Freq: Every day | ORAL | 0 refills | Status: DC
Start: 2021-08-28 — End: 2021-12-31

## 2021-08-28 NOTE — Progress Notes (Signed)
Virginia Hayes [id number] November 12, 1960 61 y.o.  Subjective:   Patient ID:  Virginia Hayes is a 61 y.o. (DOB [birth year]) female.  Chief Complaint:  Chief Complaint  Patient presents with   Follow-up   Depression   ADHD   Anxiety    Depression        Associated symptoms include no decreased concentration and no suicidal ideas.  Past medical history includes anxiety.   Medication Refill Associated symptoms include arthralgias. Pertinent negatives include no weakness.  Anxiety Symptoms include nervous/anxious behavior. Patient reports no confusion, decreased concentration, palpitations or suicidal ideas.     Virginia Hayes presents to the office today for follow-up of depression and anxiety and ADD.  07/2019 appt noted: I'm doing good.  Needs Adderall to work.   S/P shoulder and gall bladder surgery. Seems fluoxetine is less effective and seems to have low serotonin feeling about 18 hours after taking fluoxetine and notices more weepy and less joy.   Ambien at 11 pm and often awake until 3-4 AM.  Taking on empty stomach.  No caffeine after lunch.   Pretty stable with the Ambien.  Occ insomnia that doesn't work.  Can't sleep without Ambien.  Patient reports stable mood and denies depressed or irritable moods.  anxiety is a little worse.  anxiety re: son Virginia Hayes and uses Xanax..  Patient denies difficulty with sleep initiation or maintenance. Denies appetite disturbance.  Patient reports that energy and motivation have been good.  Patient has difficulty with concentration.  Patient denies any suicidal ideation. Plan: Disc initial insomnia. 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   Seroquel 25 to 50 mg nightly. Taper off Zolpidem  03/11/2020 appointment noted: Doing fine back on zolpidem. Satisfied with meds.  Quetiapine 25 mg didn't work without Ambien but good with it.  Stays asleep without problems with quetiapine. Good with other meds. Patient reports stable mood and  denies depressed or irritable moods.  Patient denies any recent difficulty with anxiety.  Patient denies difficulty with sleep initiation or maintenance. Denies appetite disturbance.  Patient reports that energy and motivation have been good.  Patient denies any difficulty with concentration.  Patient denies any suicidal ideation. Son severe OCD and completely disabled.  A big stress.  He did better on higher risperidone.  But had weight gain.  Touching issues and tics.  Avoidant.  Minimizes sx.  Has episodes of explosive anger over his OCD.  Can rant for an hour and then be remorseful.  A lot of concerns about him. His severe OCD drives her anxiety and depression.  She won't place him.  His issues are her issues. Gets withdrawal swimmy headed ness for hours before the next dose is due.  Adderall will help mood and social anxiety and motivation and wants to take it again.  Sometimes sits on the couch all day long.  08/20/20 appt noted: Retired.  Enjoys [employer].  Tough dealing with [name] who has bad OCD and anger outbursts.  He needs counseling.  His sx worsen her sx. Thinks prozac works but maybe too well. Plan: Trintellix 5 mg daily in the AM and reduce the Prozac to 60 mg daily for aweek, Then increase Trintellix to 10 mg daily and reduce Prozac to 40 mg daily for a week, Then increase Trintellix 10 +5 mg daily and reduce Prozac to 20 mg daily for a week, Then increase Trintellix to 20 mg daily and stop Prozac Disc initial insomnia. Chronic insomnia has become immune  tolerant to Ambien.  She has failed to be able to stop it. Seroquel 25 to 50 mg nightly. OK to continue zolpidem  11/05/20 appt noted : Rough time with transition.  Rough with withdrawal.  Crying.  Couldn't take it.  Added prozac 40 mg daily and dropped Trintellix to 10 mg daily.  Still feels flat.  Seems like sleep is worse.  Chronically foggy and buzzy feeling in her head.  Feels better with Prozac 80 than now. No severe nausea with  Trintellix. Plan: Abilify 2.5 mg daily for 1 week and if NR then 5 mg daily. DC Trintellix and increase to 60 Prozac  02/27/2021 appointment with the following noted: Covid second time. Abilify 2.5 mg is wonderful with all the difference. So much better than without it. Adderall helps mood too. Split Prozac and no withdrawal. Happier and more energy.  Noticed it quickly. Knee surgery.  Sleep is ok with Seroquel and Ambien Plan: No med changes  08/28/2021 appointment with the following noted: Reduced fluoxetine to 40 mg daily and on Abilify 5.  Stopped Abilify DT wt gain about 6-8 weeks ago. CO steady wt gain over a few years and trying to fight it. So far is ok without it.   Just lately started having knocking feeling in her head. Over the years Adderall helps the depression the most.     Past Psychiatric Medication Trials:  Trazodone NR, Xanax hangover,  Zoloft, paroxetine, Wellbutrin, Lexapro, Prozac, Trintellix Vyvanse liked it.  Review of Systems:  Review of Systems  Cardiovascular:  Negative for palpitations.  Musculoskeletal:  Positive for arthralgias.  Neurological:  Negative for tremors and weakness.  Psychiatric/Behavioral:  Positive for depression. Negative for agitation, behavioral problems, confusion, decreased concentration, dysphoric mood, hallucinations, self-injury, sleep disturbance and suicidal ideas. The patient is nervous/anxious. The patient is not hyperactive.     Medications: I have reviewed the patient's current medications.  Current Outpatient Medications  Medication Sig Dispense Refill   ALPRAZolam (XANAX) 0.5 MG tablet TAKE 1 TABLET BY MOUTH AT BEDTIME AS NEEDED FOR ANXIETY. 30 tablet 1   amphetamine-dextroamphetamine (ADDERALL) 20 MG tablet Take 1 tablet (20 mg total) by mouth 2 (two) times daily. 60 tablet 0   amphetamine-dextroamphetamine (ADDERALL) 20 MG tablet Take 1 tablet (20 mg total) by mouth 2 (two) times daily. 60 tablet 0    amphetamine-dextroamphetamine (ADDERALL) 20 MG tablet Take 1 tablet (20 mg total) by mouth 2 (two) times daily. 60 tablet 0   azelastine (ASTELIN) 0.1 % nasal spray Place 2 sprays into both nostrils 2 (two) times daily. Use in each nostril as directed 30 mL 5   Cholecalciferol (D 5000) 125 MCG (5000 UT) capsule Take by mouth.     etanercept (ENBREL) 50 MG/ML injection Inject 50 mg into the skin once a week.     fexofenadine (ALLEGRA) 60 MG tablet Take 60 mg by mouth 2 (two) times daily.     fluticasone (FLONASE) 50 MCG/ACT nasal spray Place 1 spray into both nostrils daily as needed for allergies or rhinitis. 18.2 mL 5   lisdexamfetamine (VYVANSE) 70 MG capsule Take 1 capsule (70 mg total) by mouth daily. 30 capsule 0   meloxicam (MOBIC) 15 MG tablet      pantoprazole (PROTONIX) 20 MG tablet Take 20 mg by mouth daily.     rosuvastatin (CRESTOR) 10 MG tablet Take 10 mg by mouth daily.     zolpidem (AMBIEN) 10 MG tablet Take 1 tablet (10 mg total) by mouth  at bedtime as needed. 90 tablet 0   FLUoxetine (PROZAC) 20 MG capsule Take 3 capsules (60 mg total) by mouth daily. 270 capsule 1   levocetirizine (XYZAL) 5 MG tablet TAKE 1 TABLET (5 MG TOTAL) BY MOUTH DAILY AS NEEDED FOR ALLERGIES. (Patient not taking: Reported on 11/05/2020) 90 tablet 1   QUEtiapine (SEROQUEL) 25 MG tablet Take 2 tablets (50 mg total) by mouth at bedtime. 180 tablet 0   No current facility-administered medications for this visit.    Medication Side Effects: Other: SSRI withdrawal  Allergies: No Known Allergies  Past Medical History:  Diagnosis Date   Chest pain    Chest pain    Depression    Dyslipidemia    Hyperlipidemia    Insomnia    Orthopnea    RA (rheumatoid arthritis) (HCC)    Shoulder bursitis    SOB (shortness of breath)     Family History  Problem Relation Age of Onset   Heart disease Father     Social History   Socioeconomic History   Marital status: Divorced    Spouse name: Not on file    Number of children: Not on file   Years of education: Not on file   Highest education level: Not on file  Occupational History   Not on file  Tobacco Use   Smoking status: Never   Smokeless tobacco: Never  Vaping Use   Vaping Use: Never used  Substance and Sexual Activity   Alcohol use: No   Drug use: No   Sexual activity: Not on file  Other Topics Concern   Not on file  Social History Narrative   Not on file   Social Determinants of Health   Financial Resource Strain: Not on file  Food Insecurity: Not on file  Transportation Needs: Not on file  Physical Activity: Not on file  Stress: Not on file  Social Connections: Not on file  Intimate Partner Violence: Not on file    Past Medical History, Surgical history, Social history, and Family history were reviewed and updated as appropriate.   Disability  Please see review of systems for further details on the patient's review from today.   Objective:   Physical Exam:  BP (!) 138/97   Pulse (!) 103   Physical Exam Constitutional:      General: She is not in acute distress.    Appearance: She is well-developed.  Musculoskeletal:        General: No deformity.  Neurological:     Mental Status: She is alert and oriented to person, place, and time.     Motor: No tremor.     Coordination: Coordination normal.     Gait: Gait normal.  Psychiatric:        Attention and Perception: Attention normal. She is attentive.        Mood and Affect: Mood is anxious. Mood is not depressed. Affect is not labile, blunt, angry or inappropriate.        Speech: Speech normal.        Behavior: Behavior normal.        Thought Content: Thought content normal. Thought content is not delusional. Thought content does not include homicidal or suicidal ideation. Thought content does not include suicidal plan.        Cognition and Memory: Cognition normal.        Judgment: Judgment normal.     Comments: Insight is good. Some chronic stress due  to her son's mental  illness     Lab Review:     Component Value Date/Time   NA 143 12/06/2011 2023   K 3.8 12/06/2011 2023   CL 106 12/06/2011 2023   CO2 31 11/10/2008 0850   GLUCOSE 123 (H) 12/06/2011 2023   BUN 14 12/06/2011 2023   CREATININE 1.00 12/06/2011 2023   CALCIUM 9.0 11/10/2008 0850   GFRNONAA >60 11/10/2008 0850   GFRAA  11/10/2008 0850    >60        The eGFR has been calculated using the MDRD equation. This calculation has not been validated in all clinical situations. eGFR's persistently <60 mL/min signify possible Chronic Kidney Disease.       Component Value Date/Time   WBC 10.4 12/06/2011 1950   RBC 4.61 12/06/2011 1950   HGB 13.6 12/06/2011 2023   HCT 40.0 12/06/2011 2023   PLT 291 12/06/2011 1950   MCV 85.7 12/06/2011 1950   MCH 29.5 12/06/2011 1950   MCHC 34.4 12/06/2011 1950   RDW 12.8 12/06/2011 1950   LYMPHSABS 2.2 12/06/2011 1950   MONOABS 0.5 12/06/2011 1950   EOSABS 0.2 12/06/2011 1950   BASOSABS 0.0 12/06/2011 1950    No results found for: "POCLITH", "LITHIUM"   No results found for: "PHENYTOIN", "PHENOBARB", "VALPROATE", "CBMZ"   .res Assessment: Plan:    Attention deficit hyperactivity disorder (ADHD), combined type - Plan: lisdexamfetamine (VYVANSE) 70 MG capsule  Insomnia due to mental condition - Plan: QUEtiapine (SEROQUEL) 25 MG tablet  Recurrent major depression in full remission ([place]) - Plan: FLUoxetine (PROZAC) 20 MG capsule  Social anxiety disorder - Plan: FLUoxetine (PROZAC) 20 MG capsule   Greater than 50% of face to face time with patient was spent on counseling and coordination of care. We discussed her difficulty with transition to Trintellix with experience of greater depression and a feeling of withdrawal from serotonin.  We discussed alternatives. Was somewhat flat on higher dose fluoxetine up to 80 mg daily..  We discussed partial dopamine agonist at length and their side effects.  She got benefit from Abilify  but stopped it due to gradual weight gain. If she has recurrence of depression off the Abilify which is possible, Vraylar would be a lower weight gain option. Discussed potential metabolic side effects associated with atypical antipsychotics, as well as potential risk for movement side effects. Advised pt to contact office if movement side effects occur.   continue 40 Prozac Recommend try splitting the dose because she seems to be a rapid metabolizer.  Discussed the risk of increased depression with the lower dose.  She feels that the stimulant has a really positive effect on her mood and would like to consider switching back to Vyvanse for the longer duration.  She had to stop it in the past due to cost  Swittch Vyvanse for longer duration. Discussed potential benefits, risks, and side effects of stimulants with patient to include increased heart rate, palpitations, insomnia, increased anxiety, increased irritability, or decreased appetite.  Instructed patient to contact office if experiencing any significant tolerability issues.  Sleep is better with the quetiapine. 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  She has failed to be able to stop it. Seroquel 25 to 50 mg nightly. OK to continue zolpidem or try gradual taper Disc SE and tachyphylaxis.  Disc withdrawal.   Patient is unlikely to experience antipsychotic side effects from Seroquel at this low-dose.  FU 3 mos  Lynder Parents, MD, DFAPA   Please see After Visit Summary  for patient specific instructions.  No future appointments.  No orders of the defined types were placed in this encounter.      -------------------------------

## 2021-09-04 ENCOUNTER — Other Ambulatory Visit: Payer: Self-pay

## 2021-09-04 ENCOUNTER — Telehealth: Payer: Self-pay | Admitting: Psychiatry

## 2021-09-04 DIAGNOSIS — F902 Attention-deficit hyperactivity disorder, combined type: Secondary | ICD-10-CM

## 2021-09-04 MED ORDER — AMPHETAMINE-DEXTROAMPHETAMINE 20 MG PO TABS: 20.0000 mg | ORAL_TABLET | Freq: Two times a day (BID) | ORAL | 0 refills | Status: DC

## 2021-09-04 NOTE — Telephone Encounter (Signed)
Patient stated the Vyvanse refill last week was not in stock at the pharmacy. Patient lvm requesting a 1 month supply on Adderall to sustain her until available.  Fill at [id number], [place] - [address] [facility] [address]  [address], [place] [address]  Phone:  [phone]  Fax:  [phone]   Next app 11/27/21

## 2021-09-04 NOTE — Telephone Encounter (Signed)
Patient's pharmacy has Vyvanse on backorder. Patient said they do have the Adderall in stock. She is asking for a RF on the Adderall until she is able to get Vyvanse.  Will pend the Adderall for your disposition. Do you want me to cancel the Vyvanse RF?   08/13/2021 08/06/2021 1  Zolpidem Tartrate 10 Mg Tablet 25.00 25 Ca Cot [id number] Nor (0167) 1/0 0.50 LMET Medicare [place] 08/09/2021 08/06/2021 1  Zolpidem Tartrate 10 Mg Tablet 5.00 5 Ca Cot [id number] Nor (0167) 0/0 0.50 LMET Private Pay [place] 07/02/2021 05/08/2021 1  Dextroamp-Amphetamin 20 Mg Tab 60.00 30 Ca Cot [id number] Nor (0167) 0/0 T Medicare [place] 05/15/2021 05/15/2021 1  Zolpidem Tartrate 10 Mg Tablet 90.00 90 Ca Cot [id number] Nor (0167) 0/0 0.50 LMET Medicare [place] 05/08/2021 05/08/2021 1  Dextroamp-Amphetamin 20 Mg Tab 60.00 30 Ca Cot [id number] Nor (0167) 0/0 T Medicare [place] 02/27/2021 02/27/2021 1  Dextroamp-Amphetamin 20 Mg Tab 60.00 30 Ca Cot [id number] Nor (0167) 0/0 T Medicare [place] 02/18/2021 02/18/2021 1  Hydrocodone-Acetamin 5-325 Mg 30.00 5 In [place]

## 2021-09-04 NOTE — Telephone Encounter (Signed)
No need to cancel vyvanse.  Want her to get it when available

## 2021-10-23 ENCOUNTER — Other Ambulatory Visit: Payer: Self-pay

## 2021-10-23 ENCOUNTER — Telehealth: Payer: Self-pay | Admitting: Psychiatry

## 2021-10-23 DIAGNOSIS — F902 Attention-deficit hyperactivity disorder, combined type: Secondary | ICD-10-CM

## 2021-10-23 NOTE — Telephone Encounter (Signed)
Pt requested refill of Adderall. It is available at   [id number] [place] [facility], [place] - [address] [facility] [address]  [address], [place] [address]  Phone:  [phone]  Fax:  [phone]   Next appt 10/19

## 2021-10-23 NOTE — Telephone Encounter (Signed)
Pended.

## 2021-10-24 MED ORDER — AMPHETAMINE-DEXTROAMPHETAMINE 20 MG PO TABS: 20.0000 mg | ORAL_TABLET | Freq: Two times a day (BID) | ORAL | 0 refills | Status: DC

## 2021-10-24 NOTE — Telephone Encounter (Signed)
Pt  [facility] [address] [facility] it in stock in [place] has  it in stock. Please send it there

## 2021-11-04 ENCOUNTER — Telehealth: Payer: Self-pay | Admitting: Psychiatry

## 2021-11-04 DIAGNOSIS — F5105 Insomnia due to other mental disorder: Secondary | ICD-10-CM

## 2021-11-05 NOTE — Telephone Encounter (Signed)
Pt called stating took last one last night. RF request Ambien [facility]. Apt 10/19

## 2021-11-27 ENCOUNTER — Ambulatory Visit (INDEPENDENT_AMBULATORY_CARE_PROVIDER_SITE_OTHER): Payer: Medicare Other | Admitting: Psychiatry

## 2021-12-18 ENCOUNTER — Telehealth: Payer: Self-pay | Admitting: Psychiatry

## 2021-12-18 NOTE — Telephone Encounter (Signed)
Pt called at 9:04a.  She would like refill of Vyvanse 70mg  to  [id number], [place] - [address] [facility] [address] [facility] [address], [place] Phone: [phone]  Fax: [phone]    Next appt 1/25

## 2021-12-18 NOTE — Telephone Encounter (Signed)
Filled 10/27 

## 2021-12-31 ENCOUNTER — Other Ambulatory Visit: Payer: Self-pay

## 2021-12-31 DIAGNOSIS — F902 Attention-deficit hyperactivity disorder, combined type: Secondary | ICD-10-CM

## 2021-12-31 MED ORDER — LISDEXAMFETAMINE DIMESYLATE 70 MG PO CAPS: 70.0000 mg | ORAL_CAPSULE | Freq: Every day | ORAL | 0 refills | Status: DC

## 2021-12-31 NOTE — Telephone Encounter (Signed)
Pended.

## 2022-01-26 ENCOUNTER — Other Ambulatory Visit: Payer: Self-pay | Admitting: Psychiatry

## 2022-01-26 ENCOUNTER — Other Ambulatory Visit: Payer: Self-pay

## 2022-01-26 ENCOUNTER — Telehealth: Payer: Self-pay | Admitting: Psychiatry

## 2022-01-26 DIAGNOSIS — F5105 Insomnia due to other mental disorder: Secondary | ICD-10-CM

## 2022-01-26 NOTE — Telephone Encounter (Signed)
Pended.

## 2022-01-26 NOTE — Telephone Encounter (Signed)
Patient lvm requesting a refill on the Ambien. Fill at [id number], [place] - [address] [facility] [address], [place] [address] Phone: [phone]  Fax: [phone]   Next appointment scheduled for 03/05/22

## 2022-02-26 ENCOUNTER — Other Ambulatory Visit: Payer: Self-pay | Admitting: Psychiatry

## 2022-02-26 DIAGNOSIS — F3342 Major depressive disorder, recurrent, in full remission: Secondary | ICD-10-CM

## 2022-02-26 DIAGNOSIS — F4010 Social phobia, unspecified: Secondary | ICD-10-CM

## 2022-03-03 ENCOUNTER — Ambulatory Visit (INDEPENDENT_AMBULATORY_CARE_PROVIDER_SITE_OTHER): Payer: Medicare Other | Admitting: Psychiatry

## 2022-03-03 ENCOUNTER — Encounter: Payer: Self-pay | Admitting: Psychiatry

## 2022-03-03 VITALS — BP 127/89 | HR 119

## 2022-03-03 DIAGNOSIS — F902 Attention-deficit hyperactivity disorder, combined type: Secondary | ICD-10-CM

## 2022-03-03 DIAGNOSIS — F331 Major depressive disorder, recurrent, moderate: Secondary | ICD-10-CM

## 2022-03-03 DIAGNOSIS — F5105 Insomnia due to other mental disorder: Secondary | ICD-10-CM

## 2022-03-03 DIAGNOSIS — F4010 Social phobia, unspecified: Secondary | ICD-10-CM

## 2022-03-03 MED ORDER — AUVELITY 45-105 MG PO TBCR: EXTENDED_RELEASE_TABLET | ORAL | 1 refills | Status: DC

## 2022-03-03 MED ORDER — LISDEXAMFETAMINE DIMESYLATE 70 MG PO CAPS: 70.0000 mg | ORAL_CAPSULE | Freq: Every day | ORAL | 0 refills | Status: DC

## 2022-03-03 NOTE — Progress Notes (Signed)
Virginia Hayes [id number] 08/27/1960 62 y.o.  Subjective:   Patient ID:  Virginia Hayes is a 62 y.o. (DOB 1960/05/21) female.  Chief Complaint:  Chief Complaint  Patient presents with   Follow-up   ADHD   Depression    Depression        Associated symptoms include no decreased concentration and no suicidal ideas.  Past medical history includes anxiety.   Medication Refill Associated symptoms include arthralgias. Pertinent negatives include no weakness.  Anxiety Symptoms include nervous/anxious behavior. Patient reports no confusion, decreased concentration, palpitations or suicidal ideas.     Virginia Hayes presents to the office today for follow-up of depression and anxiety and ADD.  07/2019 appt noted: I'm doing good.  Needs Adderall to work.   S/P shoulder and gall bladder surgery. Seems fluoxetine is less effective and seems to have low serotonin feeling about 18 hours after taking fluoxetine and notices more weepy and less joy.   Ambien at 11 pm and often awake until 3-4 AM.  Taking on empty stomach.  No caffeine after lunch.   Pretty stable with the Ambien.  Occ insomnia that doesn't work.  Can't sleep without Ambien.  Patient reports stable mood and denies depressed or irritable moods.  anxiety is a little worse.  anxiety re: son [name] and uses Xanax..  Patient denies difficulty with sleep initiation or maintenance. Denies appetite disturbance.  Patient reports that energy and motivation have been good.  Patient has difficulty with concentration.  Patient denies any suicidal ideation. Plan: Disc initial insomnia. 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   Seroquel 25 to 50 mg nightly. Taper off Zolpidem  03/11/2020 appointment noted: Doing fine back on zolpidem. Satisfied with meds.  Quetiapine 25 mg didn't work without Ambien but good with it.  Stays asleep without problems with quetiapine. Good with other meds. Patient reports stable mood and denies  depressed or irritable moods.  Patient denies any recent difficulty with anxiety.  Patient denies difficulty with sleep initiation or maintenance. Denies appetite disturbance.  Patient reports that energy and motivation have been good.  Patient denies any difficulty with concentration.  Patient denies any suicidal ideation. Son severe OCD and completely disabled.  A big stress.  He did better on higher risperidone.  But had weight gain.  Touching issues and tics.  Avoidant.  Minimizes sx.  Has episodes of explosive anger over his OCD.  Can rant for an hour and then be remorseful.  A lot of concerns about him. His severe OCD drives her anxiety and depression.  She won't place him.  His issues are her issues. Gets withdrawal swimmy headed ness for hours before the next dose is due.  Adderall will help mood and social anxiety and motivation and wants to take it again.  Sometimes sits on the couch all day long.  08/20/20 appt noted: Retired.  Enjoys Gkids.  Tough dealing with [name] who has bad OCD and anger outbursts.  He needs counseling.  His sx worsen her sx. Thinks prozac works but maybe too well. Plan: Trintellix 5 mg daily in the AM and reduce the Prozac to 60 mg daily for aweek, Then increase Trintellix to 10 mg daily and reduce Prozac to 40 mg daily for a week, Then increase Trintellix 10 +5 mg daily and reduce Prozac to 20 mg daily for a week, Then increase Trintellix to 20 mg daily and stop Prozac Disc initial insomnia. 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  She has failed to be able to stop it. Seroquel 25 to 50 mg nightly. OK to continue zolpidem  11/05/20 appt noted : Rough time with transition.  Rough with withdrawal.  Crying.  Couldn't take it.  Added prozac 40 mg daily and dropped Trintellix to 10 mg daily.  Still feels flat.  Seems like sleep is worse.  Chronically foggy and buzzy feeling in her head.  Feels better with Prozac 80 than now. No severe nausea with  Trintellix. Plan: Abilify 2.5 mg daily for 1 week and if NR then 5 mg daily. DC Trintellix and increase to 60 Prozac  02/27/2021 appointment with the following noted: Covid second time. Abilify 2.5 mg is wonderful with all the difference. So much better than without it. Adderall helps mood too. Split Prozac and no withdrawal. Happier and more energy.  Noticed it quickly. Knee surgery.  Sleep is ok with Seroquel and Ambien Plan: No med changes  08/28/2021 appointment with the following noted: Reduced fluoxetine to 40 mg daily and on Abilify 5.  Stopped Abilify DT wt gain about 6-8 weeks ago. CO steady wt gain over a few years and trying to fight it. So far is ok without it.   Just lately started having knocking feeling in her head. Over the years Adderall helps the depression the most.   Plan: Swittch Vyvanse for longer duration.  03/04/21 appt noted: Likes Vyvanse longer duration and smoother.  No Se problem A little jittery. Struggling to lose weight since on antidepressants.   Affects self esteem. No knocking sensation in her head but is still depressed. Life is hard with 2 kids not doing well. [name]'s OCD is bad.  Oldest son living with her is addict and is recovering.  Dx paranoid psychosis.  Can't have relationships bc family. In counseling.     Past Psychiatric Medication Trials:  Trazodone NR, Xanax hangover,  Zoloft, paroxetine, Wellbutrin, Lexapro, Prozac, Trintellix Vyvanse liked it.  Review of Systems:  Review of Systems  Cardiovascular:  Negative for palpitations.  Musculoskeletal:  Positive for arthralgias.  Neurological:  Negative for tremors and weakness.  Psychiatric/Behavioral:  Negative for agitation, behavioral problems, confusion, decreased concentration, dysphoric mood, hallucinations, self-injury, sleep disturbance and suicidal ideas. The patient is nervous/anxious. The patient is not hyperactive.     Medications: I have reviewed the patient's current  medications.  Current Outpatient Medications  Medication Sig Dispense Refill   ALPRAZolam (XANAX) 0.5 MG tablet TAKE 1 TABLET BY MOUTH AT BEDTIME AS NEEDED FOR ANXIETY. (Patient taking differently: rare) 30 tablet 1   azelastine (ASTELIN) 0.1 % nasal spray Place 2 sprays into both nostrils 2 (two) times daily. Use in each nostril as directed 30 mL 5   Cholecalciferol (D 5000) 125 MCG (5000 UT) capsule Take by mouth.     Dextromethorphan-buPROPion ER (AUVELITY) 45-105 MG TBCR 1 tablet in the AM for 1 week then 1 twice daily 60 tablet 1   etanercept (ENBREL) 50 MG/ML injection Inject 50 mg into the skin once a week.     fexofenadine (ALLEGRA) 60 MG tablet Take 60 mg by mouth 2 (two) times daily.     FLUoxetine (PROZAC) 20 MG capsule TAKE 3 CAPSULES BY MOUTH EVERY DAY (Patient taking differently: Take 20 mg by mouth 2 (two) times daily.) 270 capsule 0   fluticasone (FLONASE) 50 MCG/ACT nasal spray Place 1 spray into both nostrils daily as needed for allergies or rhinitis. 18.2 mL 5   levocetirizine (XYZAL) 5 MG tablet TAKE  1 TABLET (5 MG TOTAL) BY MOUTH DAILY AS NEEDED FOR ALLERGIES. 90 tablet 1   lisdexamfetamine (VYVANSE) 70 MG capsule Take 1 capsule (70 mg total) by mouth daily. 30 capsule 0   meloxicam (MOBIC) 15 MG tablet      pantoprazole (PROTONIX) 20 MG tablet Take 20 mg by mouth daily.     QUEtiapine (SEROQUEL) 25 MG tablet Take 2 tablets (50 mg total) by mouth at bedtime. (Patient taking differently: Take 50 mg by mouth at bedtime. 1 tab HS) 180 tablet 0   rosuvastatin (CRESTOR) 10 MG tablet Take 10 mg by mouth daily.     zolpidem (AMBIEN) 10 MG tablet TAKE 1 TABLET BY MOUTH EVERY DAY AT BEDTIME AS NEEDED 90 tablet 0   lisdexamfetamine (VYVANSE) 70 MG capsule Take 1 capsule (70 mg total) by mouth daily. 30 capsule 0   No current facility-administered medications for this visit.    Medication Side Effects: Other: SSRI withdrawal  Allergies: No Known Allergies  Past Medical History:   Diagnosis Date   Chest pain    Chest pain    Depression    Dyslipidemia    Hyperlipidemia    Insomnia    Orthopnea    RA (rheumatoid arthritis) (HCC)    Shoulder bursitis    SOB (shortness of breath)     Family History  Problem Relation Age of Onset   Heart disease Father     Social History   Socioeconomic History   Marital status: Divorced    Spouse name: Not on file   Number of children: Not on file   Years of education: Not on file   Highest education level: Not on file  Occupational History   Not on file  Tobacco Use   Smoking status: Never   Smokeless tobacco: Never  Vaping Use   Vaping Use: Never used  Substance and Sexual Activity   Alcohol use: No   Drug use: No   Sexual activity: Not on file  Other Topics Concern   Not on file  Social History Narrative   Not on file   Social Determinants of Health   Financial Resource Strain: Not on file  Food Insecurity: Not on file  Transportation Needs: Not on file  Physical Activity: Not on file  Stress: Not on file  Social Connections: Not on file  Intimate Partner Violence: Not on file    Past Medical History, Surgical history, Social history, and Family history were reviewed and updated as appropriate.   Disability  Please see review of systems for further details on the patient's review from today.   Objective:   Physical Exam:  BP 127/89   Pulse (!) 119   Physical Exam Constitutional:      General: She is not in acute distress.    Appearance: She is well-developed.  Musculoskeletal:        General: No deformity.  Neurological:     Mental Status: She is alert and oriented to person, place, and time.     Motor: No tremor.     Coordination: Coordination normal.     Gait: Gait normal.  Psychiatric:        Attention and Perception: Attention normal. She is attentive.        Mood and Affect: Mood is anxious. Mood is not depressed. Affect is not labile, blunt, angry or inappropriate.         Speech: Speech normal.        Behavior: Behavior normal.  Thought Content: Thought content normal. Thought content is not delusional. Thought content does not include homicidal or suicidal ideation. Thought content does not include suicidal plan.        Cognition and Memory: Cognition normal.        Judgment: Judgment normal.     Comments: Insight is good. Some chronic stress due to her son's mental illness     Lab Review:     Component Value Date/Time   NA 143 12/06/2011 2023   K 3.8 12/06/2011 2023   CL 106 12/06/2011 2023   CO2 31 11/10/2008 0850   GLUCOSE 123 (H) 12/06/2011 2023   BUN 14 12/06/2011 2023   CREATININE 1.00 12/06/2011 2023   CALCIUM 9.0 11/10/2008 0850   GFRNONAA >60 11/10/2008 0850   GFRAA  11/10/2008 0850    >60        The eGFR has been calculated using the MDRD equation. This calculation has not been validated in all clinical situations. eGFR's persistently <60 mL/min signify possible Chronic Kidney Disease.       Component Value Date/Time   WBC 10.4 12/06/2011 1950   RBC 4.61 12/06/2011 1950   HGB 13.6 12/06/2011 2023   HCT 40.0 12/06/2011 2023   PLT 291 12/06/2011 1950   MCV 85.7 12/06/2011 1950   MCH 29.5 12/06/2011 1950   MCHC 34.4 12/06/2011 1950   RDW 12.8 12/06/2011 1950   LYMPHSABS 2.2 12/06/2011 1950   MONOABS 0.5 12/06/2011 1950   EOSABS 0.2 12/06/2011 1950   BASOSABS 0.0 12/06/2011 1950    No results found for: "POCLITH", "LITHIUM"   No results found for: "PHENYTOIN", "PHENOBARB", "VALPROATE", "CBMZ"   .res Assessment: Plan:    Major depressive disorder, recurrent episode, moderate (HCC) - Plan: Dextromethorphan-buPROPion ER (AUVELITY) 45-105 MG TBCR  Social anxiety disorder  Attention deficit hyperactivity disorder (ADHD), combined type - Plan: lisdexamfetamine (VYVANSE) 70 MG capsule  Insomnia due to mental condition   Greater than 50% of face to face time with patient was spent on counseling and coordination of  care. We discussed her difficulty with transition to Trintellix with experience of greater depression and a feeling of withdrawal from serotonin.  We discussed alternatives. Was somewhat flat on higher dose fluoxetine up to 80 mg daily..  We discussed partial dopamine agonist at length and their side effects.  She got benefit from Abilify but stopped it due to gradual weight gain. If she has recurrence of depression off the Abilify which is possible, Vraylar would be a lower weight gain option. Discussed potential metabolic side effects associated with atypical antipsychotics, as well as potential risk for movement side effects. Advised pt to contact office if movement side effects occur.   continue 40 Prozac Recommend try splitting the dose because she seems to be a rapid metabolizer.  Discussed the risk of increased depression with the lower dose.   She had to stop it in the past due to cost  Auvelity trial 1 in the AM for 1 week, then 1 twice daily  Option reduce Vyvanse.    She wants to continue 70 mg AM Discussed potential benefits, risks, and side effects of stimulants with patient to include increased heart rate, palpitations, insomnia, increased anxiety, increased irritability, or decreased appetite.  Instructed patient to contact office if experiencing any significant tolerability issues.  Sleep is better with the quetiapine. 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  She has failed to be able to stop it. Seroquel 25 to 50 mg  nightly. OK to continue zolpidem or try gradual taper Disc SE and tachyphylaxis.  Disc withdrawal.   Patient is unlikely to experience antipsychotic side effects from Seroquel at this low-dose.  FU 2 mos  Lynder Parents, MD, DFAPA   Please see After Visit Summary for patient specific instructions.  No future appointments.  No orders of the defined types were placed in this encounter.      -------------------------------

## 2022-03-05 ENCOUNTER — Ambulatory Visit: Payer: Medicare Other | Admitting: Psychiatry

## 2022-03-09 ENCOUNTER — Ambulatory Visit: Payer: Medicare Other | Admitting: Behavioral Health

## 2022-04-29 ENCOUNTER — Other Ambulatory Visit: Payer: Self-pay | Admitting: Psychiatry

## 2022-04-29 DIAGNOSIS — F5105 Insomnia due to other mental disorder: Secondary | ICD-10-CM

## 2022-04-29 NOTE — Telephone Encounter (Signed)
Pt called requesting RF generic Lorrin Mais #[id number] has 2 left and apt 3/25. Can't sleep with out it.

## 2022-04-29 NOTE — Telephone Encounter (Signed)
Pt called back at 1:07p.  She said she only has 1 pill and that's for tonight.  Pls send to [facility] on [address]  Next appt 3/25

## 2022-05-04 ENCOUNTER — Other Ambulatory Visit (HOSPITAL_BASED_OUTPATIENT_CLINIC_OR_DEPARTMENT_OTHER): Payer: Self-pay

## 2022-05-04 ENCOUNTER — Ambulatory Visit (INDEPENDENT_AMBULATORY_CARE_PROVIDER_SITE_OTHER): Payer: Medicare Other | Admitting: Psychiatry

## 2022-05-04 ENCOUNTER — Encounter: Payer: Self-pay | Admitting: Psychiatry

## 2022-05-04 DIAGNOSIS — F902 Attention-deficit hyperactivity disorder, combined type: Secondary | ICD-10-CM | POA: Diagnosis not present

## 2022-05-04 DIAGNOSIS — F331 Major depressive disorder, recurrent, moderate: Secondary | ICD-10-CM | POA: Diagnosis not present

## 2022-05-04 DIAGNOSIS — F5105 Insomnia due to other mental disorder: Secondary | ICD-10-CM | POA: Diagnosis not present

## 2022-05-04 DIAGNOSIS — F4010 Social phobia, unspecified: Secondary | ICD-10-CM

## 2022-05-04 MED ORDER — ALPRAZOLAM 0.5 MG PO TABS: 0.5000 mg | ORAL_TABLET | Freq: Three times a day (TID) | ORAL | 1 refills | Status: DC | PRN

## 2022-05-04 MED ORDER — ZOLPIDEM TARTRATE 10 MG PO TABS: 10.0000 mg | ORAL_TABLET | Freq: Every evening | ORAL | 0 refills | Status: DC | PRN

## 2022-05-04 MED ORDER — AUVELITY 45-105 MG PO TBCR: EXTENDED_RELEASE_TABLET | ORAL | 2 refills | Status: DC

## 2022-05-04 MED ORDER — LISDEXAMFETAMINE DIMESYLATE 70 MG PO CAPS
70.0000 mg | ORAL_CAPSULE | Freq: Every day | ORAL | 0 refills | Status: DC
  Filled 2022-05-04: qty 90, 90d supply, fill #0

## 2022-05-04 NOTE — Progress Notes (Signed)
AUSET FIERST CP:[id number] 04-01-1960 62 y.o.  Subjective:   Patient ID:  Virginia Hayes is a 62 y.o. (DOB 1960-07-08) female.  Chief Complaint:  Chief Complaint  Patient presents with   Follow-up   Depression   Anxiety   Stress    Depression        Associated symptoms include no decreased concentration and no suicidal ideas.  Past medical history includes anxiety.   Medication Refill Associated symptoms include arthralgias. Pertinent negatives include no weakness.  Anxiety Symptoms include nervous/anxious behavior. Patient reports no confusion, decreased concentration, palpitations or suicidal ideas.     Virginia Hayes presents to the office today for follow-up of depression and anxiety and ADD.  07/2019 appt noted: I'm doing good.  Needs Adderall to work.   S/P shoulder and gall bladder surgery. Seems fluoxetine is less effective and seems to have low serotonin feeling about 18 hours after taking fluoxetine and notices more weepy and less joy.   Ambien at 11 pm and often awake until 3-4 AM.  Taking on empty stomach.  No caffeine after lunch.   Pretty stable with the Ambien.  Occ insomnia that doesn't work.  Can't sleep without Ambien.  Patient reports stable mood and denies depressed or irritable moods.  anxiety is a little worse.  anxiety re: son [name] and uses Xanax..  Patient denies difficulty with sleep initiation or maintenance. Denies appetite disturbance.  Patient reports that energy and motivation have been good.  Patient has difficulty with concentration.  Patient denies any suicidal ideation. Plan: Disc initial insomnia. 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   Seroquel 25 to 50 mg nightly. Taper off Zolpidem  03/11/2020 appointment noted: Doing fine back on zolpidem. Satisfied with meds.  Quetiapine 25 mg didn't work without Ambien but good with it.  Stays asleep without problems with quetiapine. Good with other meds. Patient reports stable mood and  denies depressed or irritable moods.  Patient denies any recent difficulty with anxiety.  Patient denies difficulty with sleep initiation or maintenance. Denies appetite disturbance.  Patient reports that energy and motivation have been good.  Patient denies any difficulty with concentration.  Patient denies any suicidal ideation. Son severe OCD and completely disabled.  A big stress.  He did better on higher risperidone.  But had weight gain.  Touching issues and tics.  Avoidant.  Minimizes sx.  Has episodes of explosive anger over his OCD.  Can rant for an hour and then be remorseful.  A lot of concerns about him. His severe OCD drives her anxiety and depression.  She won't place him.  His issues are her issues. Gets withdrawal swimmy headed ness for hours before the next dose is due.  Adderall will help mood and social anxiety and motivation and wants to take it again.  Sometimes sits on the couch all day long.  08/20/20 appt noted: Retired.  Enjoys Gkids.  Tough dealing with [name] who has bad OCD and anger outbursts.  He needs counseling.  His sx worsen her sx. Thinks prozac works but maybe too well. Plan: Trintellix 5 mg daily in the AM and reduce the Prozac to 60 mg daily for aweek, Then increase Trintellix to 10 mg daily and reduce Prozac to 40 mg daily for a week, Then increase Trintellix 10 +5 mg daily and reduce Prozac to 20 mg daily for a week, Then increase Trintellix to 20 mg daily and stop Prozac Disc initial insomnia. Chronic insomnia has become immune  tolerant to Ambien.  She has failed to be able to stop it. Seroquel 25 to 50 mg nightly. OK to continue zolpidem  11/05/20 appt noted : Rough time with transition.  Rough with withdrawal.  Crying.  Couldn't take it.  Added prozac 40 mg daily and dropped Trintellix to 10 mg daily.  Still feels flat.  Seems like sleep is worse.  Chronically foggy and buzzy feeling in her head.  Feels better with Prozac 80 than now. No severe nausea with  Trintellix. Plan: Abilify 2.5 mg daily for 1 week and if NR then 5 mg daily. DC Trintellix and increase to 60 Prozac  02/27/2021 appointment with the following noted: Covid second time. Abilify 2.5 mg is wonderful with all the difference. So much better than without it. Adderall helps mood too. Split Prozac and no withdrawal. Happier and more energy.  Noticed it quickly. Knee surgery.  Sleep is ok with Seroquel and Ambien Plan: No med changes  08/28/2021 appointment with the following noted: Reduced fluoxetine to 40 mg daily and on Abilify 5.  Stopped Abilify DT wt gain about 6-8 weeks ago. CO steady wt gain over a few years and trying to fight it. So far is ok without it.   Just lately started having knocking feeling in her head. Over the years Adderall helps the depression the most.   Plan: Swittch Vyvanse for longer duration.  03/04/21 appt noted: Likes Vyvanse longer duration and smoother.  No Se problem A little jittery. Struggling to lose weight since on antidepressants.   Affects self esteem. No knocking sensation in her head but is still depressed. Life is hard with 2 kids not doing well. [name]'s OCD is bad.  Oldest son living with her is addict and is recovering.  Dx paranoid psychosis.  Can't have relationships bc family. In counseling.   Plan: Auvelity trial 1 in the AM for 1 week, then 1 twice daily Continue fluoxetine 40 Continue vyvanse 70 AM Continue Ambien 10 HS  05/04/22 appt noted: RA caused need for joint replacement.  Embrel helped a lot.   No ups and downs.  Feels Auvelity has helped.   Not depressed .  More even and happier.  Can cry when need to cry. No SE with Auvelity. Not jittery   Past Psychiatric Medication Trials:  Trazodone NR, Xanax hangover,  Zoloft, paroxetine, Wellbutrin, Lexapro, Prozac, Trintellix Vyvanse liked it.  Review of Systems:  Review of Systems  Cardiovascular:  Negative for palpitations.  Musculoskeletal:  Positive for  arthralgias.  Neurological:  Negative for tremors and weakness.  Psychiatric/Behavioral:  Negative for agitation, behavioral problems, confusion, decreased concentration, dysphoric mood, hallucinations, self-injury, sleep disturbance and suicidal ideas. The patient is nervous/anxious. The patient is not hyperactive.     Medications: I have reviewed the patient's current medications.  Current Outpatient Medications  Medication Sig Dispense Refill   azelastine (ASTELIN) 0.1 % nasal spray Place 2 sprays into both nostrils 2 (two) times daily. Use in each nostril as directed 30 mL 5   Cholecalciferol (D 5000) 125 MCG (5000 UT) capsule Take by mouth.     etanercept (ENBREL) 50 MG/ML injection Inject 50 mg into the skin once a week.     fexofenadine (ALLEGRA) 60 MG tablet Take 60 mg by mouth 2 (two) times daily.     FLUoxetine (PROZAC) 20 MG capsule TAKE 3 CAPSULES BY MOUTH EVERY DAY (Patient taking differently: Take 20 mg by mouth 2 (two) times daily.) 270 capsule 0  fluticasone (FLONASE) 50 MCG/ACT nasal spray Place 1 spray into both nostrils daily as needed for allergies or rhinitis. 18.2 mL 5   levocetirizine (XYZAL) 5 MG tablet TAKE 1 TABLET (5 MG TOTAL) BY MOUTH DAILY AS NEEDED FOR ALLERGIES. 90 tablet 1   lisdexamfetamine (VYVANSE) 70 MG capsule Take 1 capsule (70 mg total) by mouth daily. 30 capsule 0   meloxicam (MOBIC) 15 MG tablet      pantoprazole (PROTONIX) 20 MG tablet Take 20 mg by mouth daily.     QUEtiapine (SEROQUEL) 25 MG tablet Take 2 tablets (50 mg total) by mouth at bedtime. (Patient taking differently: Take 50 mg by mouth at bedtime. 1 tab HS) 180 tablet 0   rosuvastatin (CRESTOR) 10 MG tablet Take 10 mg by mouth daily.     ALPRAZolam (XANAX) 0.5 MG tablet Take 1 tablet (0.5 mg total) by mouth 3 (three) times daily as needed for anxiety. 30 tablet 1   Dextromethorphan-buPROPion ER (AUVELITY) 45-105 MG TBCR 1 tablet in the AM for 1 week then 1 twice daily 60 tablet 2    lisdexamfetamine (VYVANSE) 70 MG capsule Take 1 capsule (70 mg total) by mouth daily. 90 capsule 0   zolpidem (AMBIEN) 10 MG tablet Take 1 tablet (10 mg total) by mouth at bedtime as needed for sleep. 90 tablet 0   No current facility-administered medications for this visit.    Medication Side Effects: Other: SSRI withdrawal  Allergies: No Known Allergies  Past Medical History:  Diagnosis Date   Chest pain    Chest pain    Depression    Dyslipidemia    Hyperlipidemia    Insomnia    Orthopnea    RA (rheumatoid arthritis) (HCC)    Shoulder bursitis    SOB (shortness of breath)     Family History  Problem Relation Age of Onset   Heart disease Father     Social History   Socioeconomic History   Marital status: Divorced    Spouse name: Not on file   Number of children: Not on file   Years of education: Not on file   Highest education level: Not on file  Occupational History   Not on file  Tobacco Use   Smoking status: Never   Smokeless tobacco: Never  Vaping Use   Vaping Use: Never used  Substance and Sexual Activity   Alcohol use: No   Drug use: No   Sexual activity: Not on file  Other Topics Concern   Not on file  Social History Narrative   Not on file   Social Determinants of Health   Financial Resource Strain: Not on file  Food Insecurity: Not on file  Transportation Needs: Not on file  Physical Activity: Not on file  Stress: Not on file  Social Connections: Not on file  Intimate Partner Violence: Not on file    Past Medical History, Surgical history, Social history, and Family history were reviewed and updated as appropriate.   Disability  Please see review of systems for further details on the patient's review from today.   Objective:   Physical Exam:  There were no vitals taken for this visit.  Physical Exam Constitutional:      General: She is not in acute distress.    Appearance: She is well-developed.  Musculoskeletal:         General: No deformity.  Neurological:     Mental Status: She is alert and oriented to person, place, and time.  Motor: No tremor.     Coordination: Coordination normal.     Gait: Gait normal.  Psychiatric:        Attention and Perception: Attention normal. She is attentive.        Mood and Affect: Mood is anxious. Mood is not depressed. Affect is not labile, blunt, angry or inappropriate.        Speech: Speech normal.        Behavior: Behavior normal.        Thought Content: Thought content normal. Thought content is not delusional. Thought content does not include homicidal or suicidal ideation. Thought content does not include suicidal plan.        Cognition and Memory: Cognition normal.        Judgment: Judgment normal.     Comments: Insight is good. Some chronic stress due to her son's mental illness     Lab Review:     Component Value Date/Time   NA 143 12/06/2011 2023   K 3.8 12/06/2011 2023   CL 106 12/06/2011 2023   CO2 31 11/10/2008 0850   GLUCOSE 123 (H) 12/06/2011 2023   BUN 14 12/06/2011 2023   CREATININE 1.00 12/06/2011 2023   CALCIUM 9.0 11/10/2008 0850   GFRNONAA >60 11/10/2008 0850   GFRAA  11/10/2008 0850    >60        The eGFR has been calculated using the MDRD equation. This calculation has not been validated in all clinical situations. eGFR's persistently <60 mL/min signify possible Chronic Kidney Disease.       Component Value Date/Time   WBC 10.4 12/06/2011 1950   RBC 4.61 12/06/2011 1950   HGB 13.6 12/06/2011 2023   HCT 40.0 12/06/2011 2023   PLT 291 12/06/2011 1950   MCV 85.7 12/06/2011 1950   MCH 29.5 12/06/2011 1950   MCHC 34.4 12/06/2011 1950   RDW 12.8 12/06/2011 1950   LYMPHSABS 2.2 12/06/2011 1950   MONOABS 0.5 12/06/2011 1950   EOSABS 0.2 12/06/2011 1950   BASOSABS 0.0 12/06/2011 1950    No results found for: "POCLITH", "LITHIUM"   No results found for: "PHENYTOIN", "PHENOBARB", "VALPROATE", "CBMZ"   .res Assessment:  Plan:    Major depressive disorder, recurrent episode, moderate (HCC) - Plan: Dextromethorphan-buPROPion ER (AUVELITY) 45-105 MG TBCR  Social anxiety disorder - Plan: ALPRAZolam (XANAX) 0.5 MG tablet  Attention deficit hyperactivity disorder (ADHD), combined type - Plan: lisdexamfetamine (VYVANSE) 70 MG capsule  Insomnia due to mental condition - Plan: zolpidem (AMBIEN) 10 MG tablet   Greater than 50% of face to face time with patient was spent on counseling and coordination of care. We discussed her difficulty with transition to Trintellix with experience of greater depression and a feeling of withdrawal from serotonin.  We discussed alternatives. Was somewhat flat on higher dose fluoxetine up to 80 mg daily..  We discussed partial dopamine agonist at length and their side effects.  She got benefit from Abilify but stopped it due to gradual weight gain. If she has recurrence of depression off the Abilify which is possible, Vraylar would be a lower weight gain option. Discussed potential metabolic side effects associated with atypical antipsychotics, as well as potential risk for movement side effects. Advised pt to contact office if movement side effects occur.   reduce Prozac 20 mg daily; she wants to try to wean. Disc slow weaning given benefit of   Auvelity 1 twice daily  Option reduce Vyvanse.    She wants to continue 54  mg AM Discussed potential benefits, risks, and side effects of stimulants with patient to include increased heart rate, palpitations, insomnia, increased anxiety, increased irritability, or decreased appetite.  Instructed patient to contact office if experiencing any significant tolerability issues.  Sleep is better with the quetiapine. 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  She has failed to be able to stop it. Seroquel 25 to 50 mg nightly. OK to continue zolpidem or try gradual taper Disc SE and tachyphylaxis.  Disc withdrawal.   Patient is unlikely  to experience antipsychotic side effects from Seroquel at this low-dose.  FU 3 mos  Lynder Parents, MD, DFAPA   Please see After Visit Summary for patient specific instructions.  No future appointments.   No orders of the defined types were placed in this encounter.      -------------------------------

## 2022-05-06 ENCOUNTER — Other Ambulatory Visit (HOSPITAL_BASED_OUTPATIENT_CLINIC_OR_DEPARTMENT_OTHER): Payer: Self-pay

## 2022-05-12 ENCOUNTER — Other Ambulatory Visit (HOSPITAL_COMMUNITY): Payer: Self-pay

## 2022-05-12 MED ORDER — WEGOVY 0.25 MG/0.5ML ~~LOC~~ SOAJ
0.2500 mg | SUBCUTANEOUS | 0 refills | Status: AC
  Filled 2022-05-12 – 2022-06-10 (×3): qty 2, 28d supply, fill #0

## 2022-05-26 ENCOUNTER — Other Ambulatory Visit (HOSPITAL_BASED_OUTPATIENT_CLINIC_OR_DEPARTMENT_OTHER): Payer: Self-pay

## 2022-05-30 ENCOUNTER — Other Ambulatory Visit: Payer: Self-pay | Admitting: Psychiatry

## 2022-05-30 DIAGNOSIS — F4010 Social phobia, unspecified: Secondary | ICD-10-CM

## 2022-05-30 DIAGNOSIS — F3342 Major depressive disorder, recurrent, in full remission: Secondary | ICD-10-CM

## 2022-06-10 ENCOUNTER — Other Ambulatory Visit (HOSPITAL_BASED_OUTPATIENT_CLINIC_OR_DEPARTMENT_OTHER): Payer: Self-pay

## 2022-06-15 ENCOUNTER — Other Ambulatory Visit (HOSPITAL_BASED_OUTPATIENT_CLINIC_OR_DEPARTMENT_OTHER): Payer: Self-pay

## 2022-06-16 ENCOUNTER — Other Ambulatory Visit (HOSPITAL_BASED_OUTPATIENT_CLINIC_OR_DEPARTMENT_OTHER): Payer: Self-pay

## 2022-06-22 ENCOUNTER — Other Ambulatory Visit (HOSPITAL_BASED_OUTPATIENT_CLINIC_OR_DEPARTMENT_OTHER): Payer: Self-pay

## 2022-06-23 ENCOUNTER — Other Ambulatory Visit (HOSPITAL_BASED_OUTPATIENT_CLINIC_OR_DEPARTMENT_OTHER): Payer: Self-pay

## 2022-07-29 ENCOUNTER — Other Ambulatory Visit: Payer: Self-pay | Admitting: Psychiatry

## 2022-07-29 ENCOUNTER — Telehealth: Payer: Self-pay | Admitting: Psychiatry

## 2022-07-29 DIAGNOSIS — F902 Attention-deficit hyperactivity disorder, combined type: Secondary | ICD-10-CM

## 2022-07-29 MED ORDER — LISDEXAMFETAMINE DIMESYLATE 70 MG PO CAPS: 70.0000 mg | ORAL_CAPSULE | Freq: Every day | ORAL | 0 refills | Status: DC

## 2022-07-29 NOTE — Telephone Encounter (Signed)
Pt called on 6/18@ 5:07p requesting refill of Vyvanse 70mg  90# to   [facility] [address], [place] [address] Phone: [phone]  Fax: [phone]   She is going out of town on Friday for 10 days and she will run out of medicine while she out of town.  Next appt 7/23

## 2022-07-30 ENCOUNTER — Other Ambulatory Visit (HOSPITAL_BASED_OUTPATIENT_CLINIC_OR_DEPARTMENT_OTHER): Payer: Self-pay

## 2022-07-30 ENCOUNTER — Other Ambulatory Visit: Payer: Self-pay | Admitting: Psychiatry

## 2022-07-30 DIAGNOSIS — F902 Attention-deficit hyperactivity disorder, combined type: Secondary | ICD-10-CM

## 2022-08-03 ENCOUNTER — Other Ambulatory Visit (HOSPITAL_BASED_OUTPATIENT_CLINIC_OR_DEPARTMENT_OTHER): Payer: Self-pay

## 2022-08-09 ENCOUNTER — Other Ambulatory Visit: Payer: Self-pay | Admitting: Psychiatry

## 2022-08-09 DIAGNOSIS — F4010 Social phobia, unspecified: Secondary | ICD-10-CM

## 2022-08-10 ENCOUNTER — Ambulatory Visit (INDEPENDENT_AMBULATORY_CARE_PROVIDER_SITE_OTHER): Payer: Medicare Other | Admitting: Professional Counselor

## 2022-08-10 ENCOUNTER — Other Ambulatory Visit (HOSPITAL_BASED_OUTPATIENT_CLINIC_OR_DEPARTMENT_OTHER): Payer: Self-pay

## 2022-08-10 ENCOUNTER — Other Ambulatory Visit: Payer: Self-pay

## 2022-08-10 ENCOUNTER — Encounter: Payer: Self-pay | Admitting: Professional Counselor

## 2022-08-10 DIAGNOSIS — F902 Attention-deficit hyperactivity disorder, combined type: Secondary | ICD-10-CM

## 2022-08-10 DIAGNOSIS — F411 Generalized anxiety disorder: Secondary | ICD-10-CM | POA: Diagnosis not present

## 2022-08-10 DIAGNOSIS — F331 Major depressive disorder, recurrent, moderate: Secondary | ICD-10-CM | POA: Diagnosis not present

## 2022-08-10 MED ORDER — LISDEXAMFETAMINE DIMESYLATE 70 MG PO CAPS
70.0000 mg | ORAL_CAPSULE | Freq: Every day | ORAL | 0 refills | Status: DC
  Filled 2022-08-10: qty 90, 90d supply, fill #0
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
## 2022-08-10 NOTE — Telephone Encounter (Signed)
Canceled at [facility], pended at [facility] [facility].

## 2022-08-10 NOTE — Telephone Encounter (Signed)
Taliya called because the [facility] [address] did not have a prescription for her Vyvanse.  She didn't get it before, She is now going out of town Wednesday.  The [facility] and they don't have the medication.  Please send the prescription to [facility]

## 2022-08-11 ENCOUNTER — Other Ambulatory Visit (HOSPITAL_BASED_OUTPATIENT_CLINIC_OR_DEPARTMENT_OTHER): Payer: Self-pay

## 2022-08-11 ENCOUNTER — Other Ambulatory Visit: Payer: Self-pay

## 2022-08-11 ENCOUNTER — Encounter: Payer: Self-pay | Admitting: Professional Counselor

## 2022-08-11 NOTE — Patient Instructions (Addendum)
Return for follow-up in one week  Practice strategies for boundary implementation and self care

## 2022-09-01 ENCOUNTER — Ambulatory Visit: Payer: Medicare Other | Admitting: Psychiatry

## 2022-09-02 ENCOUNTER — Other Ambulatory Visit: Payer: Self-pay | Admitting: Adult Health

## 2022-09-02 DIAGNOSIS — F4010 Social phobia, unspecified: Secondary | ICD-10-CM

## 2022-09-02 NOTE — Telephone Encounter (Signed)
Due 7/29

## 2022-09-03 ENCOUNTER — Ambulatory Visit: Payer: Medicare Other | Admitting: Professional Counselor

## 2022-09-08 ENCOUNTER — Encounter: Payer: Self-pay | Admitting: Professional Counselor

## 2022-09-08 ENCOUNTER — Ambulatory Visit (INDEPENDENT_AMBULATORY_CARE_PROVIDER_SITE_OTHER): Payer: Medicare Other | Admitting: Professional Counselor

## 2022-09-08 DIAGNOSIS — F411 Generalized anxiety disorder: Secondary | ICD-10-CM | POA: Diagnosis not present

## 2022-09-08 DIAGNOSIS — F331 Major depressive disorder, recurrent, moderate: Secondary | ICD-10-CM

## 2022-09-08 NOTE — Progress Notes (Signed)
      [facility] Counselor/Therapist Progress Note  Patient ID: Virginia Hayes, MRN: [id number],    Date: 09/08/2022  Time Spent: 11:08a - 12:07p  Treatment Type: Individual Therapy  Reported Symptoms: worries, sadness, stress, caregiver strain, isolating tendencies, fatigue, grief/loss  Mental Status Exam:  Appearance:   Neat     Behavior:  Appropriate and Sharing  Motor:  Normal  Speech/Language:   Clear and Coherent and Normal Rate  Affect:  Appropriate and Congruent  Mood:  normal  Thought process:  normal  Thought content:    WNL  Sensory/Perceptual disturbances:    WNL  Orientation:  oriented to person, place, time/date, and situation  Attention:  Good  Concentration:  Good  Memory:  WNL  Fund of knowledge:   Good  Insight:    Good  Judgment:   Good  Impulse Control:  Good   Risk Assessment: Danger to Self:  No Self-injurious Behavior: No Danger to Others: No Duty to Warn:no Physical Aggression / Violence:No  Access to Firearms a concern: No  Gang Involvement:No   Subjective: Pt voiced having utilized assertiveness skills discussed last session and to have stood her ground where decision made in household, not letting herself be overcome with guilt or giving in to unreasonable pleas. Counselor affirmed pt self adovacy, and helpfulness of boundary setting for family member also, in nurturing self sufficiency. Pt voiced concern for additional family member and her sense of need for higher level of care; pt voiced worry for delicacy of situation, and counselor helped build safety around circumstance while also acknowledging needs to be met; strategies discussed. Pt identified sense of grief around how her home life feels constraining to her, and she identified desire for improvement which she sees on the horizon as she works to make change. Pt and counselor discussed pt knowing her limitations. They discussed impact on pt spiritual well-being. Pt reported challenges with  her medications and intention to consult with prescribing provider. Pt and counselor discussed pt treatment plan at length and pt gave her consent.  Interventions: Assertiveness/Communication, Solution-Oriented/Positive Psychology, Humanistic/Existential, and Insight-Oriented  Diagnosis:   ICD-10-CM   1. Generalized anxiety disorder  F41.1     2. Major depressive disorder, recurrent episode, moderate (HCC)  F33.1       Plan: Pt is scheduled for a follow-up. Continue process work and develop Pharmacologist. Pt to journal, and practice assertiveness and safety planning strategies until next session.  Gaspar Bidding, [facility]

## 2022-09-25 ENCOUNTER — Ambulatory Visit: Payer: Medicare Other | Admitting: Professional Counselor

## 2022-10-06 ENCOUNTER — Telehealth: Payer: Self-pay | Admitting: Psychiatry

## 2022-10-06 DIAGNOSIS — F331 Major depressive disorder, recurrent, moderate: Secondary | ICD-10-CM

## 2022-10-06 MED ORDER — AUVELITY 45-105 MG PO TBCR: EXTENDED_RELEASE_TABLET | ORAL | 0 refills | Status: DC

## 2022-10-06 NOTE — Telephone Encounter (Signed)
Rx sent. Has FU 9/9.

## 2022-10-06 NOTE — Telephone Encounter (Signed)
Patient called in for refill on Auvelity 45-105mg . States she is completely out and is asking for a 90 day supply. Ph: [phone] Appt 9/9 [facility] [address] [place], [place]

## 2022-10-16 ENCOUNTER — Other Ambulatory Visit: Payer: Self-pay | Admitting: Psychiatry

## 2022-10-16 DIAGNOSIS — F3342 Major depressive disorder, recurrent, in full remission: Secondary | ICD-10-CM

## 2022-10-16 DIAGNOSIS — F4010 Social phobia, unspecified: Secondary | ICD-10-CM

## 2022-10-19 ENCOUNTER — Ambulatory Visit (INDEPENDENT_AMBULATORY_CARE_PROVIDER_SITE_OTHER): Payer: Medicare Other | Admitting: Psychiatry

## 2022-10-19 DIAGNOSIS — Z91199 Patient's noncompliance with other medical treatment and regimen due to unspecified reason: Secondary | ICD-10-CM

## 2022-10-20 ENCOUNTER — Telehealth: Payer: Self-pay | Admitting: Psychiatry

## 2022-10-20 ENCOUNTER — Ambulatory Visit (INDEPENDENT_AMBULATORY_CARE_PROVIDER_SITE_OTHER): Payer: Medicare Other | Admitting: Professional Counselor

## 2022-10-20 ENCOUNTER — Other Ambulatory Visit: Payer: Self-pay

## 2022-10-20 DIAGNOSIS — F411 Generalized anxiety disorder: Secondary | ICD-10-CM

## 2022-10-20 DIAGNOSIS — F5105 Insomnia due to other mental disorder: Secondary | ICD-10-CM

## 2022-10-20 DIAGNOSIS — F331 Major depressive disorder, recurrent, moderate: Secondary | ICD-10-CM

## 2022-10-20 MED ORDER — ZOLPIDEM TARTRATE 10 MG PO TABS
10.0000 mg | ORAL_TABLET | Freq: Every evening | ORAL | 1 refills | Status: DC | PRN
Start: 2022-10-20 — End: 2022-12-15

## 2022-10-20 NOTE — Progress Notes (Signed)
No show

## 2022-10-20 NOTE — Telephone Encounter (Signed)
Pended Ambien only.

## 2022-10-20 NOTE — Telephone Encounter (Signed)
Pt at checkout today with Virginia Hayes found out she missed her appt with Dr Jennelle Human yesterday.  She is asking for refill of Ambien and Vyvanse.  She is out of meds tomorrow.  She [facility]  [id number] [place], [place] - [address], [place] [address] Phone: [phone]  Fax: [phone]   She requests the Vyvanse get sent to   [facility] [address] [place] [address] Phone: [phone]  Fax: [phone]   As [facility]  Next appt 10/17

## 2022-10-20 NOTE — Telephone Encounter (Signed)
Patient got a 90-day supply of Vyvanse on 7/2, is not due for a RF yet. Just needs Ambien now.

## 2022-10-25 ENCOUNTER — Encounter: Payer: Self-pay | Admitting: Professional Counselor
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
## 2022-11-03 ENCOUNTER — Encounter: Payer: Self-pay | Admitting: Professional Counselor

## 2022-11-03 ENCOUNTER — Ambulatory Visit: Payer: Medicare Other | Admitting: Professional Counselor

## 2022-11-03 DIAGNOSIS — F411 Generalized anxiety disorder: Secondary | ICD-10-CM

## 2022-11-03 DIAGNOSIS — F331 Major depressive disorder, recurrent, moderate: Secondary | ICD-10-CM | POA: Diagnosis not present
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
## 2022-11-05 ENCOUNTER — Telehealth: Payer: Self-pay | Admitting: Psychiatry

## 2022-11-05 NOTE — Telephone Encounter (Signed)
Please verify her last RF on Vyvanse and if it was a 30-day or 90-day fill.

## 2022-11-05 NOTE — Telephone Encounter (Signed)
Due 9/30

## 2022-11-05 NOTE — Telephone Encounter (Signed)
Pt lvm that she needs  her vyvanse 70 mg 90 day supply [facility] at [place]

## 2022-11-05 NOTE — Telephone Encounter (Signed)
L/F 08/11/2022   90 d fill. Lisdexamfetamine 70 Mg Capsule

## 2022-11-06 ENCOUNTER — Telehealth: Payer: Self-pay | Admitting: Psychiatry

## 2022-11-06 ENCOUNTER — Other Ambulatory Visit: Payer: Self-pay | Admitting: Psychiatry

## 2022-11-06 DIAGNOSIS — F331 Major depressive disorder, recurrent, moderate: Secondary | ICD-10-CM

## 2022-11-06 NOTE — Telephone Encounter (Signed)
It was filled thru pharmacy RF request.

## 2022-11-06 NOTE — Telephone Encounter (Signed)
Next appt is 11/26/22. Patient requesting a refill on her Auvility called to:  [id number] [place], [place] - [address]   Phone: [phone]  Fax: [phone]

## 2022-11-09 ENCOUNTER — Telehealth: Payer: Self-pay | Admitting: Psychiatry

## 2022-11-09 ENCOUNTER — Other Ambulatory Visit: Payer: Self-pay

## 2022-11-09 DIAGNOSIS — F902 Attention-deficit hyperactivity disorder, combined type: Secondary | ICD-10-CM

## 2022-11-09 MED ORDER — LISDEXAMFETAMINE DIMESYLATE 70 MG PO CAPS
70.0000 mg | ORAL_CAPSULE | Freq: Every day | ORAL | 0 refills | Status: DC
  Filled 2022-11-09: qty 30, 30d supply, fill #0

## 2022-11-09 NOTE — Telephone Encounter (Signed)
PT called in to request RF Vyvanse 70mg  RX, to go to : [facility] pharmacy on [address], [place]

## 2022-11-09 NOTE — Telephone Encounter (Signed)
Lf 08/11/22; lv 10/19/22; nv 10/08

## 2022-11-09 NOTE — Telephone Encounter (Signed)
Pended.

## 2022-11-09 NOTE — Telephone Encounter (Signed)
Please verify last RF of Vyvanse 70.

## 2022-11-10 ENCOUNTER — Other Ambulatory Visit: Payer: Self-pay

## 2022-11-10 ENCOUNTER — Other Ambulatory Visit (HOSPITAL_BASED_OUTPATIENT_CLINIC_OR_DEPARTMENT_OTHER): Payer: Self-pay

## 2022-11-11 ENCOUNTER — Other Ambulatory Visit (HOSPITAL_BASED_OUTPATIENT_CLINIC_OR_DEPARTMENT_OTHER): Payer: Self-pay

## 2022-11-15 NOTE — Telephone Encounter (Signed)
Has RX at pharmacy for Vyvanse

## 2022-11-17 ENCOUNTER — Ambulatory Visit: Payer: Medicare Other | Admitting: Professional Counselor

## 2022-11-23 ENCOUNTER — Other Ambulatory Visit: Payer: Self-pay | Admitting: Psychiatry

## 2022-11-23 DIAGNOSIS — F4010 Social phobia, unspecified: Secondary | ICD-10-CM

## 2022-11-26 ENCOUNTER — Ambulatory Visit (INDEPENDENT_AMBULATORY_CARE_PROVIDER_SITE_OTHER): Payer: Self-pay | Admitting: Psychiatry

## 2022-11-26 DIAGNOSIS — Z91199 Patient's noncompliance with other medical treatment and regimen due to unspecified reason: Secondary | ICD-10-CM

## 2022-11-26 NOTE — Progress Notes (Signed)
No show

## 2022-12-01 ENCOUNTER — Ambulatory Visit (INDEPENDENT_AMBULATORY_CARE_PROVIDER_SITE_OTHER): Payer: Medicare Other | Admitting: Professional Counselor

## 2022-12-11 ENCOUNTER — Other Ambulatory Visit: Payer: Self-pay

## 2022-12-11 ENCOUNTER — Telehealth: Payer: Self-pay | Admitting: Psychiatry

## 2022-12-11 ENCOUNTER — Other Ambulatory Visit (HOSPITAL_BASED_OUTPATIENT_CLINIC_OR_DEPARTMENT_OTHER): Payer: Self-pay

## 2022-12-11 DIAGNOSIS — F902 Attention-deficit hyperactivity disorder, combined type: Secondary | ICD-10-CM

## 2022-12-11 DIAGNOSIS — F331 Major depressive disorder, recurrent, moderate: Secondary | ICD-10-CM

## 2022-12-11 MED ORDER — LISDEXAMFETAMINE DIMESYLATE 70 MG PO CAPS
70.0000 mg | ORAL_CAPSULE | Freq: Every day | ORAL | 0 refills | Status: DC
  Filled 2022-12-11: qty 30, 30d supply, fill #0

## 2022-12-11 NOTE — Telephone Encounter (Signed)
Patient called in for refill on Auvelity. She is asking for a prescription that will last until appt 1/6. Ph: [phone] [facility] [address] [place], [place]

## 2022-12-11 NOTE — Telephone Encounter (Signed)
PT lvm 10/31 @ 8:24P requesting refill of Ambien to   [id number] [place], [place] - [address] [facility], [place] [address] Phone: [phone]  Fax: [phone]   And Vyvanse  [facility] [address], [place] [address] Phone: [phone]  Fax: [phone]    She is requesting 90 day refill on both meds.  She said she will be out over the weekend.    Next appt 1/6

## 2022-12-11 NOTE — Telephone Encounter (Signed)
PENDED

## 2022-12-11 NOTE — Telephone Encounter (Signed)
AMBIEN LF 10/10 NOT DUE 11/7 PENDED VYVANSE

## 2022-12-14 MED ORDER — AUVELITY 45-105 MG PO TBCR
EXTENDED_RELEASE_TABLET | ORAL | 0 refills | Status: DC
Start: 2022-12-14 — End: 2023-01-14

## 2022-12-14 NOTE — Telephone Encounter (Signed)
Pt called again stating that she is out of this medicine and really needs this sent in. She has been out since last week

## 2022-12-15 ENCOUNTER — Encounter: Payer: Self-pay | Admitting: Psychiatry

## 2022-12-15 ENCOUNTER — Other Ambulatory Visit: Payer: Self-pay

## 2022-12-15 ENCOUNTER — Telehealth: Payer: Self-pay | Admitting: Psychiatry

## 2022-12-15 DIAGNOSIS — F5105 Insomnia due to other mental disorder: Secondary | ICD-10-CM

## 2022-12-15 MED ORDER — ZOLPIDEM TARTRATE 10 MG PO TABS
10.0000 mg | ORAL_TABLET | Freq: Every evening | ORAL | 0 refills | Status: DC | PRN
Start: 2022-12-15 — End: 2023-01-14

## 2022-12-15 NOTE — Telephone Encounter (Signed)
PT lvm that she needs her ambien filled ASAP . She is leaving to go out of town tomorrow and she only has one pill for tonight. [facility] [address] in [place]

## 2022-12-15 NOTE — Telephone Encounter (Signed)
PENDED

## 2022-12-16 ENCOUNTER — Other Ambulatory Visit (HOSPITAL_BASED_OUTPATIENT_CLINIC_OR_DEPARTMENT_OTHER): Payer: Self-pay

## 2022-12-16 NOTE — Telephone Encounter (Signed)
Patient lvm stating that pharmacy received prescription for Ambien but they are unable to fill until tomorrow. She is going out of town this afternoon and pharmacy told her that if provider calls pharmacy to approve medication she can pick it up. Ph: [phone] Appt 1/6

## 2023-01-14 ENCOUNTER — Other Ambulatory Visit: Payer: Self-pay

## 2023-01-14 ENCOUNTER — Telehealth: Payer: Self-pay | Admitting: Psychiatry

## 2023-01-14 ENCOUNTER — Other Ambulatory Visit: Payer: Self-pay | Admitting: Psychiatry

## 2023-01-14 DIAGNOSIS — F331 Major depressive disorder, recurrent, moderate: Secondary | ICD-10-CM

## 2023-01-14 DIAGNOSIS — F902 Attention-deficit hyperactivity disorder, combined type: Secondary | ICD-10-CM

## 2023-01-14 DIAGNOSIS — F5105 Insomnia due to other mental disorder: Secondary | ICD-10-CM

## 2023-01-14 MED ORDER — AUVELITY 45-105 MG PO TBCR
EXTENDED_RELEASE_TABLET | ORAL | 0 refills | Status: DC
Start: 2023-01-14 — End: 2023-02-15

## 2023-01-14 NOTE — Telephone Encounter (Signed)
Sent Avelity. Pended Ambien and Vyvanse to the separate requested pharmacies.

## 2023-01-14 NOTE — Telephone Encounter (Signed)
Pt called and LVM @ 11:05a stating that she needs these scripts filled before her appt with Dr Jennelle Human.  [facility] and Ambien to  [id number] [place], [place] - [address], [place] [address] Phone: [phone]  Fax: [phone]   And Vyvanse to   [facility] [address] [place] [address] Phone: [phone]  Fax: [phone]   She said she only has 1 Ambien pill for tonight and she has 2 left of Vyvanse.  Next appt 1/6

## 2023-01-15 ENCOUNTER — Other Ambulatory Visit: Payer: Self-pay | Admitting: Psychiatry

## 2023-01-15 ENCOUNTER — Other Ambulatory Visit (HOSPITAL_BASED_OUTPATIENT_CLINIC_OR_DEPARTMENT_OTHER): Payer: Self-pay

## 2023-01-15 DIAGNOSIS — F902 Attention-deficit hyperactivity disorder, combined type: Secondary | ICD-10-CM

## 2023-01-15 MED ORDER — LISDEXAMFETAMINE DIMESYLATE 70 MG PO CAPS
70.0000 mg | ORAL_CAPSULE | Freq: Every day | ORAL | 0 refills | Status: DC
  Filled 2023-01-15 – 2023-03-17 (×2): qty 30, 30d supply, fill #0

## 2023-01-15 MED ORDER — ZOLPIDEM TARTRATE 10 MG PO TABS
10.0000 mg | ORAL_TABLET | Freq: Every evening | ORAL | 0 refills | Status: DC | PRN
Start: 2023-01-15 — End: 2023-02-15

## 2023-01-15 MED ORDER — LISDEXAMFETAMINE DIMESYLATE 70 MG PO CAPS
70.0000 mg | ORAL_CAPSULE | Freq: Every day | ORAL | 0 refills | Status: DC
  Filled 2023-01-15: qty 30, 30d supply, fill #0

## 2023-02-15 ENCOUNTER — Encounter: Payer: Self-pay | Admitting: Psychiatry

## 2023-02-15 ENCOUNTER — Telehealth (INDEPENDENT_AMBULATORY_CARE_PROVIDER_SITE_OTHER): Payer: Medicare Other | Admitting: Psychiatry

## 2023-02-15 ENCOUNTER — Other Ambulatory Visit (HOSPITAL_BASED_OUTPATIENT_CLINIC_OR_DEPARTMENT_OTHER): Payer: Self-pay

## 2023-02-15 DIAGNOSIS — F331 Major depressive disorder, recurrent, moderate: Secondary | ICD-10-CM | POA: Diagnosis not present

## 2023-02-15 DIAGNOSIS — F5105 Insomnia due to other mental disorder: Secondary | ICD-10-CM

## 2023-02-15 DIAGNOSIS — F411 Generalized anxiety disorder: Secondary | ICD-10-CM

## 2023-02-15 DIAGNOSIS — F902 Attention-deficit hyperactivity disorder, combined type: Secondary | ICD-10-CM | POA: Diagnosis not present

## 2023-02-15 DIAGNOSIS — F4010 Social phobia, unspecified: Secondary | ICD-10-CM

## 2023-02-15 MED ORDER — ALPRAZOLAM 0.5 MG PO TABS: 0.5000 mg | ORAL_TABLET | Freq: Three times a day (TID) | ORAL | 3 refills | Status: DC | PRN

## 2023-02-15 MED ORDER — LISDEXAMFETAMINE DIMESYLATE 70 MG PO CAPS
70.0000 mg | ORAL_CAPSULE | Freq: Every day | ORAL | 0 refills | Status: DC
  Filled 2023-02-15: qty 30, 30d supply, fill #0
  Filled 2023-04-20: qty 30, 30d supply, fill #1

## 2023-02-15 MED ORDER — ZOLPIDEM TARTRATE 10 MG PO TABS: 10.0000 mg | ORAL_TABLET | Freq: Every evening | ORAL | 0 refills | Status: DC | PRN

## 2023-02-15 MED ORDER — AUVELITY 45-105 MG PO TBCR
EXTENDED_RELEASE_TABLET | ORAL | 1 refills | Status: DC
Start: 2023-02-15 — End: 2023-09-21

## 2023-02-15 MED ORDER — QUETIAPINE FUMARATE 25 MG PO TABS: 25.0000 mg | ORAL_TABLET | Freq: Every day | ORAL | Status: AC

## 2023-02-15 NOTE — Progress Notes (Signed)
 Virginia Hayes [id number] 1960-05-27 63 y.o.  Video Visit via My Chart  I connected with pt by video using My Chart and verified that I am speaking with the correct person using two identifiers.   I discussed the limitations, risks, security and privacy concerns of performing an evaluation and management service by My Chart  and the availability of in person appointments. I also discussed with the patient that there may be a patient responsible charge related to this service. The patient expressed understanding and agreed to proceed.  I discussed the assessment and treatment plan with the patient. The patient was provided an opportunity to ask questions and all were answered. The patient agreed with the plan and demonstrated an understanding of the instructions.   The patient was advised to call back or seek an in-person evaluation if the symptoms worsen or if the condition fails to improve as anticipated.  I provided 30 minutes of video time during this encounter.  The patient was located at home and the provider was located office. Session from [phone]  Subjective:   Patient ID:  Virginia Hayes is a 63 y.o. (DOB May 19, 1960) female.  Chief Complaint:  Chief Complaint  Patient presents with   Follow-up   Depression   ADD   Anxiety    Depression        Associated symptoms include no decreased concentration and no suicidal ideas.  Past medical history includes anxiety.   Medication Refill Associated symptoms include arthralgias. Pertinent negatives include no weakness.  Anxiety Symptoms include nervous/anxious behavior. Patient reports no confusion, decreased concentration, palpitations or suicidal ideas.     Virginia Hayes presents to the office today for follow-up of depression and anxiety and ADD.  07/2019 appt noted: I'm doing good.  Needs Adderall to work.   S/P shoulder and gall bladder surgery. Seems fluoxetine  is less effective and seems to have low serotonin feeling  about 18 hours after taking fluoxetine  and notices more weepy and less joy.   Ambien  at 11 pm and often awake until 3-4 AM.  Taking on empty stomach.  No caffeine after lunch.   Pretty stable with the Ambien .  Occ insomnia that doesn't work.  Can't sleep without Ambien .  Patient reports stable mood and denies depressed or irritable moods.  anxiety is a little worse.  anxiety re: son Sheppard and uses Xanax ..  Patient denies difficulty with sleep initiation or maintenance. Denies appetite disturbance.  Patient reports that energy and motivation have been good.  Patient has difficulty with concentration.  Patient denies any suicidal ideation. Plan: Disc initial insomnia. 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   Seroquel  25 to 50 mg nightly. Continue to try Taper off Zolpidem   03/11/2020 appointment noted: Doing fine back on zolpidem . Satisfied with meds.  Quetiapine  25 mg didn't work without Ambien  but good with it.  Stays asleep without problems with quetiapine . Good with other meds. Patient reports stable mood and denies depressed or irritable moods.  Patient denies any recent difficulty with anxiety.  Patient denies difficulty with sleep initiation or maintenance. Denies appetite disturbance.  Patient reports that energy and motivation have been good.  Patient denies any difficulty with concentration.  Patient denies any suicidal ideation. Son severe OCD and completely disabled.  A big stress.  He did better on higher risperidone.  But had weight gain.  Touching issues and tics.  Avoidant.  Minimizes sx.  Has episodes of explosive anger over his OCD.  Can rant  for an hour and then be remorseful.  A lot of concerns about him. His severe OCD drives her anxiety and depression.  She won't place him.  His issues are her issues. Gets withdrawal swimmy headed ness for hours before the next dose is due.  Adderall will help mood and social anxiety and motivation and wants to take it again.  Sometimes  sits on the couch all day long.  08/20/20 appt noted: Retired.  Enjoys Gkids.  Tough dealing with [name] who has bad OCD and anger outbursts.  He needs counseling.  His sx worsen her sx. Thinks prozac  works but maybe too well. Plan: Trintellix  5 mg daily in the AM and reduce the Prozac  to 60 mg daily for aweek, Then increase Trintellix  to 10 mg daily and reduce Prozac  to 40 mg daily for a week, Then increase Trintellix  10 +5 mg daily and reduce Prozac  to 20 mg daily for a week, Then increase Trintellix  to 20 mg daily and stop Prozac  Disc initial insomnia. 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  She has failed to be able to stop it. Seroquel  25 to 50 mg nightly. OK to continue zolpidem   11/05/20 appt noted : Rough time with transition.  Rough with withdrawal.  Crying.  Couldn't take it.  Added prozac  40 mg daily and dropped Trintellix  to 10 mg daily.  Still feels flat.  Seems like sleep is worse.  Chronically foggy and buzzy feeling in her head.  Feels better with Prozac  80 than now. No severe nausea with Trintellix . Plan: Abilify  2.5 mg daily for 1 week and if NR then 5 mg daily. DC Trintellix  and increase to 60 Prozac   02/27/2021 appointment with the following noted: Covid second time. Abilify  2.5 mg is wonderful with all the difference. So much better than without it. Adderall helps mood too. Split Prozac  and no withdrawal. Happier and more energy.  Noticed it quickly. Knee surgery.  Sleep is ok with Seroquel  and Ambien  Plan: No med changes  08/28/2021 appointment with the following noted: Reduced fluoxetine  to 40 mg daily and on Abilify  5.  Stopped Abilify  DT wt gain about 6-8 weeks ago. CO steady wt gain over a few years and trying to fight it. So far is ok without it.   Just lately started having knocking feeling in her head. Over the years Adderall helps the depression the most.   Plan: Swittch Vyvanse  for longer duration.  03/04/21 appt noted: Likes Vyvanse  longer  duration and smoother.  No Se problem A little jittery. Struggling to lose weight since on antidepressants.   Affects self esteem. No knocking sensation in her head but is still depressed. Life is hard with 2 kids not doing well. [name]'s OCD is bad.  Oldest son living with her is addict and is recovering.  Dx paranoid psychosis.  Can't have relationships bc family. In counseling.   Plan: Auvelity  trial 1 in the AM for 1 week, then 1 twice daily Continue fluoxetine  40 Continue vyvanse  70 AM Continue Ambien  10 HS  05/04/22 appt noted: RA caused need for joint replacement.  Embrel helped a lot.   No ups and downs.  Feels Auvelity  has helped.   Not depressed .  More even and happier.  Can cry when need to cry. No SE with Auvelity . Not jittery Try wean fluoxetine .    02/15/23 appt noted: Son dx schiz vs drug induced paranoia.  He's in treatment. Best AD ever is Auvelity .  No ups and downs  as had with other meds.  Been able to stop fluoxetine  without problems.  Had the knocking sensation in head and got dep while still on fluoxetine .   Weaning Ambien  to 3/4  of 10 mg tablet.still needs quetiapine  25  also to sleep. Mood is good and meds good.   No SE. Asks for RX refills. Still smooth benefit with Vyvanse  and happy with it. Plan: continue Auvelity  BID, Vyvanse  70  AM, quetiapine  25 HS, Ambien  10 HS, off fluoxetine .   Past Psychiatric Medication Trials:  Trazodone NR, Xanax  hangover,  Zoloft, paroxetine, Wellbutrin, Lexapro, Prozac ,  Trintellix  had SSRI WD when tried to switch to this from fluoxetine . Auvelity  Marked positive benefit. Vyvanse  liked it.  Review of Systems:  Review of Systems  Cardiovascular:  Negative for palpitations.  Musculoskeletal:  Positive for arthralgias.  Neurological:  Negative for tremors and weakness.  Psychiatric/Behavioral:  Negative for agitation, behavioral problems, confusion, decreased concentration, dysphoric mood, hallucinations, self-injury, sleep  disturbance and suicidal ideas. The patient is nervous/anxious. The patient is not hyperactive.     Medications: I have reviewed the patient's current medications.  Current Outpatient Medications  Medication Sig Dispense Refill   lisdexamfetamine (VYVANSE ) 70 MG capsule Take 1 capsule (70 mg total) by mouth daily. 30 capsule 0   pantoprazole (PROTONIX) 20 MG tablet Take 20 mg by mouth daily.     rosuvastatin (CRESTOR) 10 MG tablet Take 10 mg by mouth daily.     Semaglutide -Weight Management (WEGOVY ) 0.25 MG/0.5ML SOAJ Inject 0.25 mg into the skin once a week. 2 mL 0   ALPRAZolam  (XANAX ) 0.5 MG tablet Take 1 tablet (0.5 mg total) by mouth 3 (three) times daily as needed for anxiety. 30 tablet 3   azelastine  (ASTELIN ) 0.1 % nasal spray Place 2 sprays into both nostrils 2 (two) times daily. Use in each nostril as directed (Patient not taking: Reported on 02/15/2023) 30 mL 5   Cholecalciferol (D 5000) 125 MCG (5000 UT) capsule Take by mouth. (Patient not taking: Reported on 02/15/2023)     Dextromethorphan-buPROPion ER (AUVELITY ) 45-105 MG TBCR TAKE 1 TABLET BY MOUTH TWICE A DAY 180 tablet 1   etanercept (ENBREL) 50 MG/ML injection Inject 50 mg into the skin once a week. (Patient not taking: Reported on 02/15/2023)     fexofenadine (ALLEGRA) 60 MG tablet Take 60 mg by mouth 2 (two) times daily. (Patient not taking: Reported on 02/15/2023)     fluticasone  (FLONASE) 50 MCG/ACT nasal spray Place 1 spray into both nostrils daily as needed for allergies or rhinitis. (Patient not taking: Reported on 02/15/2023) 18.2 mL 5   levocetirizine (XYZAL ) 5 MG tablet TAKE 1 TABLET (5 MG TOTAL) BY MOUTH DAILY AS NEEDED FOR ALLERGIES. (Patient not taking: Reported on 02/15/2023) 90 tablet 1   lisdexamfetamine (VYVANSE ) 70 MG capsule Take 1 capsule (70 mg total) by mouth daily. 90 capsule 0   meloxicam (MOBIC) 15 MG tablet  (Patient not taking: Reported on 02/15/2023)     QUEtiapine  (SEROQUEL ) 25 MG tablet Take 1 tablet (25 mg  total) by mouth at bedtime.     zolpidem  (AMBIEN ) 10 MG tablet Take 1 tablet (10 mg total) by mouth at bedtime as needed for sleep. 30 tablet 0   No current facility-administered medications for this visit.    Medication Side Effects: Other: SSRI withdrawal  Allergies: No Known Allergies  Past Medical History:  Diagnosis Date   Chest pain    Chest pain    Depression  Dyslipidemia    Hyperlipidemia    Insomnia    Orthopnea    RA (rheumatoid arthritis) (HCC)    Shoulder bursitis    SOB (shortness of breath)     Family History  Problem Relation Age of Onset   Heart disease Father     Social History   Socioeconomic History   Marital status: Divorced    Spouse name: Not on file   Number of children: Not on file   Years of education: Not on file   Highest education level: Not on file  Occupational History   Not on file  Tobacco Use   Smoking status: Never   Smokeless tobacco: Never  Vaping Use   Vaping status: Never Used  Substance and Sexual Activity   Alcohol use: No   Drug use: No   Sexual activity: Not on file  Other Topics Concern   Not on file  Social History Narrative   Not on file   Social Drivers of Health   Financial Resource Strain: Low Risk  (04/23/2022)   Received from [facility], [facility]   Overall Financial Resource Strain (CARDIA)    Difficulty of Paying Living Expenses: Not hard at all  Food Insecurity: No Food Insecurity (04/23/2022)   Received from [facility], [facility]   Hunger Vital Sign    Worried About Running Out of Food in the Last Year: Never true    Ran Out of Food in the Last Year: Never true  Transportation Needs: No Transportation Needs (04/23/2022)   Received from [facility], [facility]   PRAPARE - Transportation    Lack of Transportation (Medical): No    Lack of Transportation (Non-Medical): No  Physical Activity: Insufficiently Active (04/23/2022)   Received from [facility], [facility]    Exercise Vital Sign    Days of Exercise per Week: 2 days    Minutes of Exercise per Session: 50 min  Stress: No Stress Concern Present (04/23/2022)   Received from [facility], [facility] - Occupational Stress Questionnaire    Feeling of Stress : Not at all  Social Connections: Socially Integrated (04/23/2022)   Received from [facility], [facility]   Social Network    How would you rate your social network (family, work, friends)?: Good participation with social networks  Intimate Partner Violence: Not At Risk (04/23/2022)   Received from [facility], [facility]   HITS    Over the last 12 months how often did your partner physically hurt you?: Never    Over the last 12 months how often did your partner insult you or talk down to you?: Never    Over the last 12 months how often did your partner threaten you with physical harm?: Never    Over the last 12 months how often did your partner scream or curse at you?: Never    Past Medical History, Surgical history, Social history, and Family history were reviewed and updated as appropriate.   Disability  Please see review of systems for further details on the patient's review from today.   Objective:   Physical Exam:  There were no vitals taken for this visit.  Physical Exam Constitutional:      General: She is not in acute distress.    Appearance: She is well-developed.  Musculoskeletal:        General: No deformity.  Neurological:     Mental Status: She is alert and oriented  to person, place, and time.     Motor: No tremor.     Coordination: Coordination normal.     Gait: Gait normal.  Psychiatric:        Attention and Perception: Attention normal. She is attentive.        Mood and Affect: Mood is anxious. Mood is not depressed. Affect is not labile, blunt, angry or inappropriate.        Speech: Speech normal.        Behavior: Behavior normal.        Thought Content:  Thought content normal. Thought content is not delusional. Thought content does not include homicidal or suicidal ideation. Thought content does not include suicidal plan.        Cognition and Memory: Cognition normal.        Judgment: Judgment normal.     Comments: Insight is good. Some chronic stress due to her son's mental illness     Lab Review:     Component Value Date/Time   NA 143 12/06/2011 2023   K 3.8 12/06/2011 2023   CL 106 12/06/2011 2023   CO2 31 11/10/2008 0850   GLUCOSE 123 (H) 12/06/2011 2023   BUN 14 12/06/2011 2023   CREATININE 1.00 12/06/2011 2023   CALCIUM 9.0 11/10/2008 0850   GFRNONAA >60 11/10/2008 0850   GFRAA  11/10/2008 0850    >60        The eGFR has been calculated using the MDRD equation. This calculation has not been validated in all clinical situations. eGFR's persistently <60 mL/min signify possible Chronic Kidney Disease.       Component Value Date/Time   WBC 10.4 12/06/2011 1950   RBC 4.61 12/06/2011 1950   HGB 13.6 12/06/2011 2023   HCT 40.0 12/06/2011 2023   PLT 291 12/06/2011 1950   MCV 85.7 12/06/2011 1950   MCH 29.5 12/06/2011 1950   MCHC 34.4 12/06/2011 1950   RDW 12.8 12/06/2011 1950   LYMPHSABS 2.2 12/06/2011 1950   MONOABS 0.5 12/06/2011 1950   EOSABS 0.2 12/06/2011 1950   BASOSABS 0.0 12/06/2011 1950    No results found for: POCLITH, LITHIUM   No results found for: PHENYTOIN, PHENOBARB, VALPROATE, CBMZ   .res Assessment: Plan:    Major depressive disorder, recurrent episode, moderate (HCC) - Plan: Dextromethorphan-buPROPion ER (AUVELITY ) 45-105 MG TBCR  Generalized anxiety disorder  Attention deficit hyperactivity disorder (ADHD), combined type - Plan: lisdexamfetamine (VYVANSE ) 70 MG capsule  Social anxiety disorder - Plan: ALPRAZolam  (XANAX ) 0.5 MG tablet  Insomnia due to mental condition - Plan: QUEtiapine  (SEROQUEL ) 25 MG tablet, zolpidem  (AMBIEN ) 10 MG tablet   30 min video face to face  time with patient was spent on counseling and coordination of care. We discussed her difficulty with transition to Trintellix  with experience of greater depression and a feeling of withdrawal from serotonin.  We discussed alternatives. Was somewhat flat on higher dose fluoxetine  up to 80 mg daily..   She got benefit from Abilify  but stopped it due to gradual weight gain.  No problems off fluoxetine .   Auvelity  1 twice daily  markedly helpful.      She wants to continue 70 mg AM Discussed potential benefits, risks, and side effects of stimulants with patient to include increased heart rate, palpitations, insomnia, increased anxiety, increased irritability, or decreased appetite.  Instructed patient to contact office if experiencing any significant tolerability issues.  Sleep is better with the quetiapine . 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  She has failed to be able to stop it. Seroquel  25  mg nightly. OK to continue zolpidem  or try gradual taper Disc SE and tachyphylaxis.  Disc withdrawal.   Patient is unlikely to experience antipsychotic side effects from Seroquel  at this low-dose.  FU 6 mos  Lorene Macintosh, MD, DFAPA   Please see After Visit Summary for patient specific instructions.  No future appointments.   No orders of the defined types were placed in this encounter.      -------------------------------

## 2023-02-16 ENCOUNTER — Other Ambulatory Visit (HOSPITAL_BASED_OUTPATIENT_CLINIC_OR_DEPARTMENT_OTHER): Payer: Self-pay

## 2023-02-22 ENCOUNTER — Telehealth: Payer: Self-pay

## 2023-02-22 NOTE — Telephone Encounter (Signed)
 Prior Authorization request faxed to [facility] for Lisdexamfetamine Dimesylate capsule, approval received from [facility] [employer] Medicare effective 02/10/2023-02/19/2024. Member ID [facility]

## 2023-03-17 ENCOUNTER — Other Ambulatory Visit (HOSPITAL_BASED_OUTPATIENT_CLINIC_OR_DEPARTMENT_OTHER): Payer: Self-pay

## 2023-03-17 ENCOUNTER — Other Ambulatory Visit: Payer: Self-pay

## 2023-03-17 ENCOUNTER — Telehealth: Payer: Self-pay | Admitting: Psychiatry

## 2023-03-17 DIAGNOSIS — F5105 Insomnia due to other mental disorder: Secondary | ICD-10-CM

## 2023-03-17 MED ORDER — ZOLPIDEM TARTRATE 10 MG PO TABS: 10.0000 mg | ORAL_TABLET | Freq: Every evening | ORAL | 1 refills | Status: DC | PRN

## 2023-03-17 NOTE — Telephone Encounter (Signed)
 Pended 90 day supply ambien  to [facility]

## 2023-03-17 NOTE — Telephone Encounter (Signed)
 Virginia Hayes called at 11:00 to request refill of her Ambien .  All other meds were sent in but not the Ambien .  Next appt 7/9.  [id number] [place], [place] - [address]     She requested a 90 day supply

## 2023-04-19 ENCOUNTER — Other Ambulatory Visit (HOSPITAL_BASED_OUTPATIENT_CLINIC_OR_DEPARTMENT_OTHER): Payer: Self-pay

## 2023-04-20 ENCOUNTER — Telehealth: Payer: Self-pay | Admitting: Psychiatry

## 2023-04-20 ENCOUNTER — Other Ambulatory Visit (HOSPITAL_BASED_OUTPATIENT_CLINIC_OR_DEPARTMENT_OTHER): Payer: Self-pay

## 2023-04-20 ENCOUNTER — Other Ambulatory Visit: Payer: Self-pay | Admitting: Psychiatry

## 2023-04-20 DIAGNOSIS — F902 Attention-deficit hyperactivity disorder, combined type: Secondary | ICD-10-CM

## 2023-04-20 MED ORDER — LISDEXAMFETAMINE DIMESYLATE 70 MG PO CAPS
70.0000 mg | ORAL_CAPSULE | Freq: Every day | ORAL | 0 refills | Status: DC
  Filled 2023-05-20 – 2023-06-16 (×3): qty 30, 30d supply, fill #0

## 2023-04-20 MED ORDER — LISDEXAMFETAMINE DIMESYLATE 70 MG PO CAPS
70.0000 mg | ORAL_CAPSULE | Freq: Every day | ORAL | 0 refills | Status: DC
  Filled 2023-07-15: qty 30, 30d supply, fill #0

## 2023-04-20 MED ORDER — LISDEXAMFETAMINE DIMESYLATE 70 MG PO CAPS
70.0000 mg | ORAL_CAPSULE | Freq: Every day | ORAL | 0 refills | Status: DC
  Filled 2023-04-20: qty 30, 30d supply, fill #0

## 2023-04-20 NOTE — Telephone Encounter (Signed)
 RF have already been pended to Dr. Jennelle Human for his review.

## 2023-04-20 NOTE — Telephone Encounter (Signed)
 Pt LVM @ 6:03p on 3/10.  She said insurance will not pay for a 90 script of Vyvanse.  She needs a new script of 30 days sent to [facility].  Pt said she is out of meds.  Next appt 7/9

## 2023-04-21 ENCOUNTER — Other Ambulatory Visit (HOSPITAL_BASED_OUTPATIENT_CLINIC_OR_DEPARTMENT_OTHER): Payer: Self-pay

## 2023-05-20 ENCOUNTER — Other Ambulatory Visit: Payer: Self-pay

## 2023-05-20 ENCOUNTER — Other Ambulatory Visit (HOSPITAL_BASED_OUTPATIENT_CLINIC_OR_DEPARTMENT_OTHER): Payer: Self-pay

## 2023-06-01 ENCOUNTER — Other Ambulatory Visit (HOSPITAL_BASED_OUTPATIENT_CLINIC_OR_DEPARTMENT_OTHER): Payer: Self-pay

## 2023-06-16 ENCOUNTER — Other Ambulatory Visit (HOSPITAL_BASED_OUTPATIENT_CLINIC_OR_DEPARTMENT_OTHER): Payer: Self-pay

## 2023-07-15 ENCOUNTER — Other Ambulatory Visit (HOSPITAL_BASED_OUTPATIENT_CLINIC_OR_DEPARTMENT_OTHER): Payer: Self-pay

## 2023-08-09 ENCOUNTER — Telehealth: Payer: Self-pay | Admitting: Psychiatry

## 2023-08-09 NOTE — Telephone Encounter (Signed)
 Next appt is 09/21/23. Shanyiah is requesting a refill on Vyvanse  70 mg. She is leaving on this Wednesday for vacation. [facility]  [id number] [place], [place] - [address]   Phone: [phone]  Fax: [phone]

## 2023-08-09 NOTE — Telephone Encounter (Signed)
LF 6/5, due 7/3

## 2023-08-11 ENCOUNTER — Other Ambulatory Visit: Payer: Self-pay

## 2023-08-11 DIAGNOSIS — F902 Attention-deficit hyperactivity disorder, combined type: Secondary | ICD-10-CM

## 2023-08-12 MED ORDER — LISDEXAMFETAMINE DIMESYLATE 70 MG PO CAPS: 70.0000 mg | ORAL_CAPSULE | Freq: Every day | ORAL | 0 refills | Status: DC

## 2023-08-12 NOTE — Telephone Encounter (Signed)
 Pended.

## 2023-08-17 ENCOUNTER — Other Ambulatory Visit: Payer: Self-pay

## 2023-08-17 ENCOUNTER — Telehealth: Payer: Self-pay | Admitting: Psychiatry

## 2023-08-17 DIAGNOSIS — F902 Attention-deficit hyperactivity disorder, combined type: Secondary | ICD-10-CM

## 2023-08-17 MED ORDER — LISDEXAMFETAMINE DIMESYLATE 70 MG PO CAPS
70.0000 mg | ORAL_CAPSULE | Freq: Every day | ORAL | 0 refills | Status: DC
Start: 2023-08-17 — End: 2023-09-21

## 2023-08-17 NOTE — Telephone Encounter (Signed)
 Pended to [facility], canceled at [facility].

## 2023-08-17 NOTE — Telephone Encounter (Signed)
 Patient called in regading prescription for Vyvanase 70mg . States she had to go of town and needs prescription [facility] [place], and resent to [facility] Ph: [phone] Appt 8/12

## 2023-08-18 ENCOUNTER — Ambulatory Visit: Payer: No Typology Code available for payment source | Admitting: Psychiatry

## 2023-09-15 ENCOUNTER — Telehealth: Payer: Self-pay | Admitting: Psychiatry

## 2023-09-15 ENCOUNTER — Other Ambulatory Visit: Payer: Self-pay | Admitting: Psychiatry

## 2023-09-15 DIAGNOSIS — F5105 Insomnia due to other mental disorder: Secondary | ICD-10-CM

## 2023-09-15 NOTE — Telephone Encounter (Signed)
 Patient is not due for a RF until 8/8, wants to pick up 8/7. She will let me know if she has enough to get her thru until she returns.

## 2023-09-15 NOTE — Telephone Encounter (Signed)
 Pt lvm that she needs a refill on her ambien  10 mg. [facility] [address] She leaves Thursday to go out of town. Next appt 08/12

## 2023-09-15 NOTE — Telephone Encounter (Signed)
 Pended.

## 2023-09-21 ENCOUNTER — Encounter: Payer: Self-pay | Admitting: Psychiatry

## 2023-09-21 ENCOUNTER — Ambulatory Visit: Admitting: Psychiatry

## 2023-09-21 DIAGNOSIS — F5105 Insomnia due to other mental disorder: Secondary | ICD-10-CM

## 2023-09-21 DIAGNOSIS — F902 Attention-deficit hyperactivity disorder, combined type: Secondary | ICD-10-CM | POA: Diagnosis not present

## 2023-09-21 DIAGNOSIS — F4010 Social phobia, unspecified: Secondary | ICD-10-CM | POA: Diagnosis not present

## 2023-09-21 DIAGNOSIS — F331 Major depressive disorder, recurrent, moderate: Secondary | ICD-10-CM

## 2023-09-21 DIAGNOSIS — F411 Generalized anxiety disorder: Secondary | ICD-10-CM

## 2023-09-21 MED ORDER — LISDEXAMFETAMINE DIMESYLATE 70 MG PO CAPS: 70.0000 mg | ORAL_CAPSULE | Freq: Every day | ORAL | 0 refills | Status: DC

## 2023-09-21 MED ORDER — AUVELITY 45-105 MG PO TBCR: EXTENDED_RELEASE_TABLET | ORAL | 1 refills | Status: DC

## 2023-09-21 MED ORDER — ZOLPIDEM TARTRATE 10 MG PO TABS: 10.0000 mg | ORAL_TABLET | Freq: Every evening | ORAL | 1 refills | Status: AC | PRN

## 2023-09-21 NOTE — Progress Notes (Signed)
 Virginia Hayes [id number] Feb 04, 1961 63 y.o.    Subjective:   Patient ID:  Virginia Hayes is a 63 y.o. (DOB 07/24/1960) female.  Chief Complaint:  Chief Complaint  Patient presents with   Follow-up   Depression   ADD   Sleeping Problem    Virginia Hayes presents to the office today for follow-up of depression and anxiety and ADD.  07/2019 appt noted: I'm doing good.  Needs Adderall to work.   S/P shoulder and gall bladder surgery. Seems fluoxetine  is less effective and seems to have low serotonin feeling about 18 hours after taking fluoxetine  and notices more weepy and less joy.   Ambien  at 11 pm and often awake until 3-4 AM.  Taking on empty stomach.  No caffeine after lunch.   Pretty stable with the Ambien .  Occ insomnia that doesn't work.  Can't sleep without Ambien .  Patient reports stable mood and denies depressed or irritable moods.  anxiety is a little worse.  anxiety re: son Virginia Hayes and uses Xanax ..  Patient denies difficulty with sleep initiation or maintenance. Denies appetite disturbance.  Patient reports that energy and motivation have been good.  Patient has difficulty with concentration.  Patient denies any suicidal ideation. Plan: Disc initial insomnia. 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   Seroquel  25 to 50 mg nightly. Continue to try Taper off Zolpidem   03/11/2020 appointment noted: Doing fine back on zolpidem . Satisfied with meds.  Quetiapine  25 mg didn't work without Ambien  but good with it.  Stays asleep without problems with quetiapine . Good with other meds. Patient reports stable mood and denies depressed or irritable moods.  Patient denies any recent difficulty with anxiety.  Patient denies difficulty with sleep initiation or maintenance. Denies appetite disturbance.  Patient reports that energy and motivation have been good.  Patient denies any difficulty with concentration.  Patient denies any suicidal ideation. Son severe OCD and completely  disabled.  A big stress.  He did better on higher risperidone.  But had weight gain.  Touching issues and tics.  Avoidant.  Minimizes sx.  Has episodes of explosive anger over his OCD.  Can rant for an hour and then be remorseful.  A lot of concerns about him. His severe OCD drives her anxiety and depression.  She won't place him.  His issues are her issues. Gets withdrawal swimmy headed ness for hours before the next dose is due.  Adderall will help mood and social anxiety and motivation and wants to take it again.  Sometimes sits on the couch all day long.  08/20/20 appt noted: Retired.  Enjoys G[employer].  Tough dealing with Virginia Hayes who has bad OCD and anger outbursts.  He needs counseling.  His sx worsen her sx. Thinks prozac  works but maybe too well. Plan: Trintellix  5 mg daily in the AM and reduce the Prozac  to 60 mg daily for aweek, Then increase Trintellix  to 10 mg daily and reduce Prozac  to 40 mg daily for a week, Then increase Trintellix  10 +5 mg daily and reduce Prozac  to 20 mg daily for a week, Then increase Trintellix  to 20 mg daily and stop Prozac  Disc initial insomnia. 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  She has failed to be able to stop it. Seroquel  25 to 50 mg nightly. OK to continue zolpidem   11/05/20 appt noted : Rough time with transition.  Rough with withdrawal.  Crying.  Couldn't take it.  Added prozac  40 mg daily and dropped Trintellix   to 10 mg daily.  Still feels flat.  Seems like sleep is worse.  Chronically foggy and buzzy feeling in her head.  Feels better with Prozac  80 than now. No severe nausea with Trintellix . Plan: Abilify  2.5 mg daily for 1 week and if NR then 5 mg daily. DC Trintellix  and increase to 60 Prozac   02/27/2021 appointment with the following noted: Covid second time. Abilify  2.5 mg is wonderful with all the difference. So much better than without it. Adderall helps mood too. Split Prozac  and no withdrawal. Happier and more energy.   Noticed it quickly. Knee surgery.  Sleep is ok with Seroquel  and Ambien  Plan: No med changes  08/28/2021 appointment with the following noted: Reduced fluoxetine  to 40 mg daily and on Abilify  5.  Stopped Abilify  DT wt gain about 6-8 weeks ago. CO steady wt gain over a few years and trying to fight it. So far is ok without it.   Just lately started having knocking feeling in her head. Over the years Adderall helps the depression the most.   Plan: Swittch Vyvanse  for longer duration.  03/04/21 appt noted: Likes Vyvanse  longer duration and smoother.  No Se problem A little jittery. Struggling to lose weight since on antidepressants.   Affects self esteem. No knocking sensation in her head but is still depressed. Life is hard with 2 kids not doing well. Virginia Hayes's OCD is bad.  Oldest son living with her is addict and is recovering.  Dx paranoid psychosis.  Can't have relationships bc family. In counseling.   Plan: Auvelity  trial 1 in the AM for 1 week, then 1 twice daily Continue fluoxetine  40 Continue vyvanse  70 AM Continue Ambien  10 HS  05/04/22 appt noted: RA caused need for joint replacement.  Embrel helped a lot.   No ups and downs.  Feels Auvelity  has helped.   Not depressed .  More even and happier.  Can cry when need to cry. No SE with Auvelity . Not jittery Try wean fluoxetine .    02/15/23 appt noted: Son dx schiz vs drug induced paranoia.  He's in treatment. Best AD ever is Auvelity .  No ups and downs as had with other meds.  Been able to stop fluoxetine  without problems.  Had the knocking sensation in head and got dep while still on fluoxetine .   Weaning Ambien  to 3/4  of 10 mg tablet.still needs quetiapine  25  also to sleep. Mood is good and meds good.   No SE. Asks for RX refills. Still smooth benefit with Vyvanse  and happy with it. Plan: continue Auvelity  BID, Vyvanse  70  AM, quetiapine  25 HS, Ambien  10 HS, off fluoxetine .  09/21/23 appt noted:  Med: Auvelity  BID, Vyvanse  70   AM, quetiapine  25 HS, Ambien  10 HS, off fluoxetine . Doing well.  Auvelity  works.  Prn quetiapine .  Doinng well Vyvanse .  Would love for Virginia Hayes to get more help with OCD.  Disabled from the OCD.   No med concerns.  Satisfied with meds. Lost 30# on Mounjaro and mood better with it too.  Past Psychiatric Medication Trials:  Trazodone NR, Xanax  hangover,  Zoloft, paroxetine, Wellbutrin, Lexapro, Prozac ,  Trintellix  had SSRI WD when tried to switch to this from fluoxetine . Auvelity  Marked positive benefit. Vyvanse  liked it.  Review of Systems:  Review of Systems  Cardiovascular:  Negative for palpitations.  Musculoskeletal:  Positive for arthralgias.  Neurological:  Negative for tremors and weakness.  Psychiatric/Behavioral:  Negative for agitation, behavioral problems, confusion, decreased concentration, dysphoric mood, hallucinations,  self-injury, sleep disturbance and suicidal ideas. The patient is nervous/anxious. The patient is not hyperactive.     Medications: I have reviewed the patient's current medications.  Current Outpatient Medications  Medication Sig Dispense Refill   ALPRAZolam  (XANAX ) 0.5 MG tablet Take 1 tablet (0.5 mg total) by mouth 3 (three) times daily as needed for anxiety. 30 tablet 3   lisdexamfetamine (VYVANSE ) 70 MG capsule Take 1 capsule (70 mg total) by mouth daily. 30 capsule 0   lisdexamfetamine (VYVANSE ) 70 MG capsule Take 1 capsule (70 mg total) by mouth daily. 30 capsule 0   pantoprazole (PROTONIX) 20 MG tablet Take 20 mg by mouth daily.     QUEtiapine  (SEROQUEL ) 25 MG tablet Take 1 tablet (25 mg total) by mouth at bedtime.     rosuvastatin (CRESTOR) 10 MG tablet Take 10 mg by mouth daily.     Semaglutide -Weight Management (WEGOVY ) 0.25 MG/0.5ML SOAJ Inject 0.25 mg into the skin once a week. 2 mL 0   azelastine  (ASTELIN ) 0.1 % nasal spray Place 2 sprays into both nostrils 2 (two) times daily. Use in each nostril as directed (Patient not taking: Reported on  09/21/2023) 30 mL 5   Cholecalciferol (D 5000) 125 MCG (5000 UT) capsule Take by mouth. (Patient not taking: Reported on 09/21/2023)     Dextromethorphan-buPROPion ER (AUVELITY ) 45-105 MG TBCR TAKE 1 TABLET BY MOUTH TWICE A DAY 180 tablet 1   etanercept (ENBREL) 50 MG/ML injection Inject 50 mg into the skin once a week. (Patient not taking: Reported on 09/21/2023)     fexofenadine (ALLEGRA) 60 MG tablet Take 60 mg by mouth 2 (two) times daily. (Patient not taking: Reported on 09/21/2023)     fluticasone  (FLONASE) 50 MCG/ACT nasal spray Place 1 spray into both nostrils daily as needed for allergies or rhinitis. (Patient not taking: Reported on 09/21/2023) 18.2 mL 5   levocetirizine (XYZAL ) 5 MG tablet TAKE 1 TABLET (5 MG TOTAL) BY MOUTH DAILY AS NEEDED FOR ALLERGIES. (Patient not taking: Reported on 09/21/2023) 90 tablet 1   lisdexamfetamine (VYVANSE ) 70 MG capsule Take 1 capsule (70 mg total) by mouth daily. 90 capsule 0   meloxicam (MOBIC) 15 MG tablet  (Patient not taking: Reported on 09/21/2023)     zolpidem  (AMBIEN ) 10 MG tablet Take 1 tablet (10 mg total) by mouth at bedtime as needed. for sleep 90 tablet 1   No current facility-administered medications for this visit.    Medication Side Effects: Other: SSRI withdrawal  Allergies: No Known Allergies  Past Medical History:  Diagnosis Date   Chest pain    Chest pain    Depression    Dyslipidemia    Hyperlipidemia    Insomnia    Orthopnea    RA (rheumatoid arthritis) (HCC)    Shoulder bursitis    SOB (shortness of breath)     Family History  Problem Relation Age of Onset   Heart disease Father     Social History   Socioeconomic History   Marital status: Divorced    Spouse name: Not on file   Number of children: Not on file   Years of education: Not on file   Highest education level: Not on file  Occupational History   Not on file  Tobacco Use   Smoking status: Never   Smokeless tobacco: Never  Vaping Use   Vaping status:  Never Used  Substance and Sexual Activity   Alcohol use: No   Drug use: No   Sexual  activity: Not on file  Other Topics Concern   Not on file  Social History Narrative   Not on file   Social Drivers of Health   Financial Resource Strain: Low Risk  (06/09/2023)   Received from [facility]   Overall Financial Resource Strain (CARDIA)    Difficulty of Paying Living Expenses: Not hard at all  Food Insecurity: No Food Insecurity (06/09/2023)   Received from [facility]   Hunger Vital Sign    Within the past 12 months, you worried that your food would run out before you got the money to buy more.: Never true    Within the past 12 months, the food you bought just didn't last and you didn't have money to get more.: Never true  Transportation Needs: No Transportation Needs (06/09/2023)   Received from [facility] - Transportation    Lack of Transportation (Medical): No    Lack of Transportation (Non-Medical): No  Physical Activity: Insufficiently Active (06/09/2023)   Received from [facility]   Exercise Vital Sign    On average, how many days per week do you engage in moderate to strenuous exercise (like a brisk walk)?: 3 days    On average, how many minutes do you engage in exercise at this level?: 30 min  Stress: No Stress Concern Present (06/09/2023)   Received from [facility] - Occupational Stress Questionnaire    Feeling of Stress : Not at all  Recent Concern: Stress - Stress Concern Present (04/22/2023)   Received from [facility] - Occupational Stress Questionnaire    Feeling of Stress : To some extent  Social Connections: Socially Integrated (06/09/2023)   Received from [facility]   Social Network    How would you rate your social network (family, work, friends)?: Good participation with social networks  Intimate Partner Violence: Not At Risk (06/09/2023)   Received from  [facility]   HITS    Over the last 12 months how often did your partner physically hurt you?: Never    Over the last 12 months how often did your partner insult you or talk down to you?: Never    Over the last 12 months how often did your partner threaten you with physical harm?: Never    Over the last 12 months how often did your partner scream or curse at you?: Never    Past Medical History, Surgical history, Social history, and Family history were reviewed and updated as appropriate.   Disability  Please see review of systems for further details on the patient's review from today.   Objective:   Physical Exam:  There were no vitals taken for this visit.  Physical Exam Constitutional:      General: She is not in acute distress.    Appearance: She is well-developed.  Musculoskeletal:        General: No deformity.  Neurological:     Mental Status: She is alert and oriented to person, place, and time.     Motor: No tremor.     Coordination: Coordination normal.     Gait: Gait normal.  Psychiatric:        Attention and Perception: Attention normal. She is attentive.        Mood and Affect: Mood is not anxious or depressed. Affect is not labile, blunt, angry or inappropriate.        Speech: Speech normal.  Behavior: Behavior normal.        Thought Content: Thought content normal. Thought content is not delusional. Thought content does not include homicidal or suicidal ideation. Thought content does not include suicidal plan.        Cognition and Memory: Cognition normal.        Judgment: Judgment normal.     Comments: Insight is good. Some chronic stress due to her son's mental illness     Lab Review:     Component Value Date/Time   NA 143 12/06/2011 2023   K 3.8 12/06/2011 2023   CL 106 12/06/2011 2023   CO2 31 11/10/2008 0850   GLUCOSE 123 (H) 12/06/2011 2023   BUN 14 12/06/2011 2023   CREATININE 1.00 12/06/2011 2023   CALCIUM 9.0 11/10/2008 0850    GFRNONAA >60 11/10/2008 0850   GFRAA  11/10/2008 0850    >60        The eGFR has been calculated using the MDRD equation. This calculation has not been validated in all clinical situations. eGFR's persistently <60 mL/min signify possible Chronic Kidney Disease.       Component Value Date/Time   WBC 10.4 12/06/2011 1950   RBC 4.61 12/06/2011 1950   HGB 13.6 12/06/2011 2023   HCT 40.0 12/06/2011 2023   PLT 291 12/06/2011 1950   MCV 85.7 12/06/2011 1950   MCH 29.5 12/06/2011 1950   MCHC 34.4 12/06/2011 1950   RDW 12.8 12/06/2011 1950   LYMPHSABS 2.2 12/06/2011 1950   MONOABS 0.5 12/06/2011 1950   EOSABS 0.2 12/06/2011 1950   BASOSABS 0.0 12/06/2011 1950    No results found for: POCLITH, LITHIUM   No results found for: PHENYTOIN, PHENOBARB, VALPROATE, CBMZ   .res Assessment: Plan:    Major depressive disorder, recurrent episode, moderate (HCC) - Plan: Dextromethorphan-buPROPion ER (AUVELITY ) 45-105 MG TBCR  Generalized anxiety disorder  Attention deficit hyperactivity disorder (ADHD), combined type - Plan: lisdexamfetamine (VYVANSE ) 70 MG capsule  Social anxiety disorder  Insomnia due to mental condition - Plan: zolpidem  (AMBIEN ) 10 MG tablet   30 min video face to face time with patient was spent on counseling and coordination of care. We discussed her difficulty with transition to Trintellix  with experience of greater depression and a feeling of withdrawal from serotonin.  We discussed alternatives. Was somewhat flat on higher dose fluoxetine  up to 80 mg daily..   She got benefit from Abilify  but stopped it due to gradual weight gain.  No problems off fluoxetine .   Auvelity  1 twice daily  markedly helpful.      She wants to continue 70 mg AM Discussed potential benefits, risks, and side effects of stimulants with patient to include increased heart rate, palpitations, insomnia, increased anxiety, increased irritability, or decreased appetite.   Instructed patient to contact office if experiencing any significant tolerability issues.  Sleep is better with the quetiapine . 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  She has failed to be able to stop it. Seroquel  25  mg nightly. OK to continue zolpidem  or try gradual taper Disc SE and tachyphylaxis.  Disc withdrawal.   Patient is unlikely to experience antipsychotic side effects from Seroquel  at this low-dose.  FU 6 mos  Lorene Macintosh, MD, DFAPA   Please see After Visit Summary for patient specific instructions.  Future Appointments  Date Time Provider Department Center  10/01/2023  2:00 PM Marinda Rocky SAILOR, MD [facility] None     No orders of the defined types were placed  in this encounter.      -------------------------------

## 2023-09-27 ENCOUNTER — Encounter: Payer: Self-pay | Admitting: Psychiatry

## 2023-09-27 ENCOUNTER — Telehealth: Payer: Self-pay

## 2023-09-27 NOTE — Telephone Encounter (Signed)
 From MyChart:  Hi there!  I remembered a drug I had compounded for autoimmune conditions and thought I remembered in a lecture it's uses for RLS.  IT IS low Dose Naltrexone.  You and I both know it is used for drug and alcohol rehab in much higher doses however in lower doses such as 2-4 mg daily it has profound affects on the immune system.  Anyway here is an article I found.  The med has to be compounded and sometimes finding a doc with an alternative mindset is hard to do. [facility] is my favorite in this area of medicine. In [place] .  Let me know what you think.  [email or web address]

## 2023-09-27 NOTE — Telephone Encounter (Signed)
 Sent to Dr. Jennelle Human via [facility]

## 2023-10-01 ENCOUNTER — Ambulatory Visit: Admitting: Internal Medicine

## 2023-11-03 ENCOUNTER — Telehealth: Payer: Self-pay | Admitting: Psychiatry

## 2023-11-03 NOTE — Telephone Encounter (Signed)
 Patient called in regards to her medications. States that feels like depression medication is working. Feels flat and has lost about 40 pounds. She is wondering if maybe some med changes need to be perhaps lowering dosage. Pls rtc to discuss [phone]

## 2023-11-03 NOTE — Telephone Encounter (Signed)
Patient notified of recommendation.

## 2023-11-03 NOTE — Telephone Encounter (Signed)
 Ok to reduce dose to 1 daily, morning or evening.  Let us  know if anything gets worse.

## 2023-11-03 NOTE — Telephone Encounter (Signed)
 Pt seen 8/12 and it was reported she was doing well on Auvelity . She said she has lost 40 lbs and since Auvelity  is lipophilic she feels like BID dosing is too much for her. She reports feeling flat for the past 2-3 weeks. Rates depression as 5/10. Nothing else going on that would cause the sx. FU 2/12  [facility] [address]

## 2023-11-24 ENCOUNTER — Other Ambulatory Visit: Payer: Self-pay | Admitting: Psychiatry

## 2023-11-24 DIAGNOSIS — F4010 Social phobia, unspecified: Secondary | ICD-10-CM

## 2023-12-23 ENCOUNTER — Telehealth: Payer: Self-pay | Admitting: Psychiatry

## 2023-12-23 NOTE — Telephone Encounter (Signed)
 Pt LVM 2 2:19p stating she is would like an earlier appt with Dr Geoffry because he medication is not working.  Moved her appt to Jan and added her to the wait list.  But she would still like a call back about her medication.  Next appt 1/7

## 2023-12-24 NOTE — Telephone Encounter (Signed)
 LVM to [facility]

## 2023-12-28 NOTE — Telephone Encounter (Signed)
 Patient said she doesn't feel like her AD is working anymore, reports this happens frequently. She said she has an appt in January and is on the wait list and she will just hold out until she sees Dr. Geoffry.

## 2024-01-13 ENCOUNTER — Other Ambulatory Visit: Payer: Self-pay

## 2024-01-13 ENCOUNTER — Telehealth: Payer: Self-pay | Admitting: Psychiatry

## 2024-01-13 DIAGNOSIS — F902 Attention-deficit hyperactivity disorder, combined type: Secondary | ICD-10-CM

## 2024-01-13 NOTE — Telephone Encounter (Signed)
 Patient lvm requesting a refill on the Vyvnase. She stated she is going out of town Monday and will need to take medication with her.

## 2024-01-13 NOTE — Telephone Encounter (Signed)
 Pended

## 2024-01-13 NOTE — Telephone Encounter (Signed)
 LF 9/8

## 2024-01-14 MED ORDER — LISDEXAMFETAMINE DIMESYLATE 70 MG PO CAPS: 70.0000 mg | ORAL_CAPSULE | Freq: Every day | ORAL | 0 refills | Status: AC

## 2024-02-17 ENCOUNTER — Encounter: Payer: Self-pay | Admitting: Psychiatry

## 2024-02-17 ENCOUNTER — Ambulatory Visit (INDEPENDENT_AMBULATORY_CARE_PROVIDER_SITE_OTHER): Admitting: Psychiatry

## 2024-02-17 DIAGNOSIS — F5105 Insomnia due to other mental disorder: Secondary | ICD-10-CM | POA: Diagnosis not present

## 2024-02-17 DIAGNOSIS — F4010 Social phobia, unspecified: Secondary | ICD-10-CM

## 2024-02-17 DIAGNOSIS — F331 Major depressive disorder, recurrent, moderate: Secondary | ICD-10-CM | POA: Diagnosis not present

## 2024-02-17 DIAGNOSIS — F902 Attention-deficit hyperactivity disorder, combined type: Secondary | ICD-10-CM

## 2024-02-17 DIAGNOSIS — F411 Generalized anxiety disorder: Secondary | ICD-10-CM

## 2024-02-17 MED ORDER — BUPROPION HCL ER (XL) 150 MG PO TB24: 150.0000 mg | ORAL_TABLET | Freq: Every day | ORAL | 0 refills | Status: AC

## 2024-02-17 MED ORDER — AUVELITY 45-105 MG PO TBCR: EXTENDED_RELEASE_TABLET | ORAL | 1 refills | Status: AC

## 2024-02-17 MED ORDER — LISDEXAMFETAMINE DIMESYLATE 70 MG PO CAPS: 70.0000 mg | ORAL_CAPSULE | Freq: Every day | ORAL | 0 refills | Status: AC

## 2024-02-17 NOTE — Progress Notes (Signed)
 Virginia Hayes [id number] Feb 10, 1960 64 y.o.    Subjective:   Patient ID:  Virginia Hayes is a 64 y.o. (DOB 16-Oct-1960) female.  Chief Complaint:  Chief Complaint  Patient presents with   Follow-up   Depression   ADD    Virginia Hayes presents to the office today for follow-up of depression and anxiety and ADD.  07/2019 appt noted: I'm doing good.  Needs Adderall to work.   S/P shoulder and gall bladder surgery. Seems fluoxetine  is less effective and seems to have low serotonin feeling about 18 hours after taking fluoxetine  and notices more weepy and less joy.   Ambien  at 11 pm and often awake until 3-4 AM.  Taking on empty stomach.  No caffeine after lunch.   Pretty stable with the Ambien .  Occ insomnia that doesn't work.  Can't sleep without Ambien .  Patient reports stable mood and denies depressed or irritable moods.  anxiety is a little worse.  anxiety re: son Sheppard and uses Xanax ..  Patient denies difficulty with sleep initiation or maintenance. Denies appetite disturbance.  Patient reports that energy and motivation have been good.  Patient has difficulty with concentration.  Patient denies any suicidal ideation. Plan: Disc initial insomnia. 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   Seroquel  25 to 50 mg nightly. Continue to try Taper off Zolpidem   03/11/2020 appointment noted: Doing fine back on zolpidem . Satisfied with meds.  Quetiapine  25 mg didn't work without Ambien  but good with it.  Stays asleep without problems with quetiapine . Good with other meds. Patient reports stable mood and denies depressed or irritable moods.  Patient denies any recent difficulty with anxiety.  Patient denies difficulty with sleep initiation or maintenance. Denies appetite disturbance.  Patient reports that energy and motivation have been good.  Patient denies any difficulty with concentration.  Patient denies any suicidal ideation. Son severe OCD and completely disabled.  A big  stress.  He did better on higher risperidone.  But had weight gain.  Touching issues and tics.  Avoidant.  Minimizes sx.  Has episodes of explosive anger over his OCD.  Can rant for an hour and then be remorseful.  A lot of concerns about him. His severe OCD drives her anxiety and depression.  She won't place him.  His issues are her issues. Gets withdrawal swimmy headed ness for hours before the next dose is due.  Adderall will help mood and social anxiety and motivation and wants to take it again.  Sometimes sits on the couch all day long.  08/20/20 appt noted: Retired.  Enjoys Gkids.  Tough dealing with [name] who has bad OCD and anger outbursts.  He needs counseling.  His sx worsen her sx. Thinks prozac  works but maybe too well. Plan: Trintellix  5 mg daily in the AM and reduce the Prozac  to 60 mg daily for aweek, Then increase Trintellix  to 10 mg daily and reduce Prozac  to 40 mg daily for a week, Then increase Trintellix  10 +5 mg daily and reduce Prozac  to 20 mg daily for a week, Then increase Trintellix  to 20 mg daily and stop Prozac  Disc initial insomnia. 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  She has failed to be able to stop it. Seroquel  25 to 50 mg nightly. OK to continue zolpidem   11/05/20 appt noted : Rough time with transition.  Rough with withdrawal.  Crying.  Couldn't take it.  Added prozac  40 mg daily and dropped Trintellix  to 10 mg daily.  Still feels flat.  Seems like sleep is worse.  Chronically foggy and buzzy feeling in her head.  Feels better with Prozac  80 than now. No severe nausea with Trintellix . Plan: Abilify  2.5 mg daily for 1 week and if NR then 5 mg daily. DC Trintellix  and increase to 60 Prozac   02/27/2021 appointment with the following noted: Covid second time. Abilify  2.5 mg is wonderful with all the difference. So much better than without it. Adderall helps mood too. Split Prozac  and no withdrawal. Happier and more energy.  Noticed it  quickly. Knee surgery.  Sleep is ok with Seroquel  and Ambien  Plan: No med changes  08/28/2021 appointment with the following noted: Reduced fluoxetine  to 40 mg daily and on Abilify  5.  Stopped Abilify  DT wt gain about 6-8 weeks ago. CO steady wt gain over a few years and trying to fight it. So far is ok without it.   Just lately started having knocking feeling in her head. Over the years Adderall helps the depression the most.   Plan: Swittch Vyvanse  for longer duration.  03/04/21 appt noted: Likes Vyvanse  longer duration and smoother.  No Se problem A little jittery. Struggling to lose weight since on antidepressants.   Affects self esteem. No knocking sensation in her head but is still depressed. Life is hard with 2 kids not doing well. [name]'s OCD is bad.  Oldest son living with her is addict and is recovering.  Dx paranoid psychosis.  Can't have relationships bc family. In counseling.   Plan: Auvelity  trial 1 in the AM for 1 week, then 1 twice daily Continue fluoxetine  40 Continue vyvanse  70 AM Continue Ambien  10 HS  05/04/22 appt noted: RA caused need for joint replacement.  Embrel helped a lot.   No ups and downs.  Feels Auvelity  has helped.   Not depressed .  More even and happier.  Can cry when need to cry. No SE with Auvelity . Not jittery Try wean fluoxetine .    02/15/23 appt noted: Son dx schiz vs drug induced paranoia.  He's in treatment. Best AD ever is Auvelity .  No ups and downs as had with other meds.  Been able to stop fluoxetine  without problems.  Had the knocking sensation in head and got dep while still on fluoxetine .   Weaning Ambien  to 3/4  of 10 mg tablet.still needs quetiapine  25  also to sleep. Mood is good and meds good.   No SE. Asks for RX refills. Still smooth benefit with Vyvanse  and happy with it. Plan: continue Auvelity  BID, Vyvanse  70  AM, quetiapine  25 HS, Ambien  10 HS, off fluoxetine .  09/21/23 appt noted:  Med: Auvelity  BID, Vyvanse  70  AM,  quetiapine  25 HS, Ambien  10 HS, off fluoxetine . Doing well.  Auvelity  works.  Prn quetiapine .  Doinng well Vyvanse .  Would love for [name] to get more help with OCD.  Disabled from the OCD.   No med concerns.  Satisfied with meds. Lost 30# on Mounjaro and mood better with it too.  11/03/23 TC  Patient called in regards to her medications. States that feels like depression medication is working. Feels flat and has lost about 40 pounds. She is wondering if maybe some med changes need to be perhaps lowering dosage. Pt seen 8/12 and it was reported she was doing well on Auvelity . She said she has lost 40 lbs and since Auvelity  is lipophilic she feels like BID dosing is too much for her. She reports feeling flat for the past  2-3 weeks. Rates depression as 5/10. Nothing else going on that would cause the sx.    MD resp:  ok to reduce to 1 Auvelity  daily.  Lorene Macintosh, MD, DFAPA     02/17/24 appt noted:  Med:  Auvelity  BID, Vyvanse  70 mg AM Sad all the time with less Auvelity .   More dep in evenings when Vyvanse  wears off.  Hard to find job.   With triggers will spiral down and can barely function  for a couple of days.   Getting outside helps.   Lost 45 # and don't want to take anything to gain wt.     Past Psychiatric Medication Trials:   Trazodone NR,  Xanax  hangover,  Zoloft, paroxetine, Lexapro, Prozac ,  Wellbutrin ,  Trintellix  had SSRI WD when tried to switch to this from fluoxetine . Auvelity  Marked positive benefit. Vyvanse  liked it.  Review of Systems:  Review of Systems  Cardiovascular:  Negative for palpitations.  Musculoskeletal:  Positive for arthralgias.  Neurological:  Negative for tremors and weakness.  Psychiatric/Behavioral:  Positive for dysphoric mood. Negative for agitation, behavioral problems, confusion, decreased concentration, hallucinations, self-injury, sleep disturbance and suicidal ideas. The patient is nervous/anxious. The patient is not hyperactive.      Medications: I have reviewed the patient's current medications.  Current Outpatient Medications  Medication Sig Dispense Refill   ALPRAZolam  (XANAX ) 0.5 MG tablet TAKE 1 TABLET (0.5 MG TOTAL) BY MOUTH 3 (THREE) TIMES DAILY AS NEEDED FOR ANXIETY. 30 tablet 2   buPROPion  (WELLBUTRIN  XL) 150 MG 24 hr tablet Take 1 tablet (150 mg total) by mouth daily. 90 tablet 0   lisdexamfetamine  (VYVANSE ) 70 MG capsule Take 1 capsule (70 mg total) by mouth daily. 30 capsule 0   lisdexamfetamine  (VYVANSE ) 70 MG capsule Take 1 capsule (70 mg total) by mouth daily. 30 capsule 0   pantoprazole (PROTONIX) 20 MG tablet Take 20 mg by mouth daily.     QUEtiapine  (SEROQUEL ) 25 MG tablet Take 1 tablet (25 mg total) by mouth at bedtime.     rosuvastatin (CRESTOR) 10 MG tablet Take 10 mg by mouth daily.     Semaglutide -Weight Management (WEGOVY ) 0.25 MG/0.5ML SOAJ Inject 0.25 mg into the skin once a week. 2 mL 0   zolpidem  (AMBIEN ) 10 MG tablet Take 1 tablet (10 mg total) by mouth at bedtime as needed. for sleep 90 tablet 1   azelastine  (ASTELIN ) 0.1 % nasal spray Place 2 sprays into both nostrils 2 (two) times daily. Use in each nostril as directed (Patient not taking: Reported on 09/21/2023) 30 mL 5   Cholecalciferol (D 5000) 125 MCG (5000 UT) capsule Take by mouth. (Patient not taking: Reported on 09/21/2023)     Dextromethorphan-buPROPion  ER (AUVELITY ) 45-105 MG TBCR TAKE 1 TABLET BY MOUTH TWICE A DAY 180 tablet 1   etanercept (ENBREL) 50 MG/ML injection Inject 50 mg into the skin once a week. (Patient not taking: Reported on 09/21/2023)     fexofenadine (ALLEGRA) 60 MG tablet Take 60 mg by mouth 2 (two) times daily. (Patient not taking: Reported on 09/21/2023)     fluticasone  (FLONASE) 50 MCG/ACT nasal spray Place 1 spray into both nostrils daily as needed for allergies or rhinitis. (Patient not taking: Reported on 09/21/2023) 18.2 mL 5   levocetirizine (XYZAL ) 5 MG tablet TAKE 1 TABLET (5 MG TOTAL) BY MOUTH DAILY AS  NEEDED FOR ALLERGIES. (Patient not taking: Reported on 09/21/2023) 90 tablet 1   lisdexamfetamine  (VYVANSE ) 70 MG capsule Take 1 capsule (  70 mg total) by mouth daily. 90 capsule 0   meloxicam (MOBIC) 15 MG tablet  (Patient not taking: Reported on 09/21/2023)     No current facility-administered medications for this visit.    Medication Side Effects: Other: SSRI withdrawal  Allergies: No Known Allergies  Past Medical History:  Diagnosis Date   Chest pain    Chest pain    Depression    Dyslipidemia    Hyperlipidemia    Insomnia    Orthopnea    RA (rheumatoid arthritis) (HCC)    Shoulder bursitis    SOB (shortness of breath)     Family History  Problem Relation Age of Onset   Heart disease Father     Social History   Socioeconomic History   Marital status: Divorced    Spouse name: Not on file   Number of children: Not on file   Years of education: Not on file   Highest education level: Not on file  Occupational History   Not on file  Tobacco Use   Smoking status: Never   Smokeless tobacco: Never  Vaping Use   Vaping status: Never Used  Substance and Sexual Activity   Alcohol use: No   Drug use: No   Sexual activity: Not on file  Other Topics Concern   Not on file  Social History Narrative   Not on file   Social Drivers of Health   Tobacco Use: Low Risk (02/17/2024)   Patient History    Smoking Tobacco Use: Never    Smokeless Tobacco Use: Never    Passive Exposure: Not on file  Financial Resource Strain: Low Risk (06/09/2023)   Received from [facility]   Overall Financial Resource Strain (CARDIA)    Difficulty of Paying Living Expenses: Not hard at all  Food Insecurity: No Food Insecurity (06/09/2023)   Received from [facility]   Epic    Within the past 12 months, you worried that your food would run out before you got the money to buy more.: Never true    Within the past 12 months, the food you bought just didn't last and you didn't have money to get  more.: Never true  Transportation Needs: No Transportation Needs (06/09/2023)   Received from [facility] - Transportation    Lack of Transportation (Medical): No    Lack of Transportation (Non-Medical): No  Physical Activity: Insufficiently Active (06/09/2023)   Received from [facility]   Exercise Vital Sign    On average, how many days per week do you engage in moderate to strenuous exercise (like a brisk walk)?: 3 days    On average, how many minutes do you engage in exercise at this level?: 30 min  Stress: No Stress Concern Present (06/09/2023)   Received from [facility] - Occupational Stress Questionnaire    Feeling of Stress : Not at all  Recent Concern: Stress - Stress Concern Present (04/22/2023)   Received from [facility] of Occupational Health - Occupational Stress Questionnaire    Feeling of Stress : To some extent  Social Connections: Socially Integrated (06/09/2023)   Received from [facility]   Social Network    How would you rate your social network (family, work, friends)?: Good participation with social networks  Intimate Partner Violence: Not At Risk (06/09/2023)   Received from [facility]   HITS    Over the last 12 months how often  did your partner physically hurt you?: Never    Over the last 12 months how often did your partner insult you or talk down to you?: Never    Over the last 12 months how often did your partner threaten you with physical harm?: Never    Over the last 12 months how often did your partner scream or curse at you?: Never  Depression (PHQ2-9): Medium Risk (08/10/2022)   Depression (PHQ2-9)    PHQ-2 Score: 7  Alcohol Screen: Not on file  Housing: Low Risk (06/09/2023)   Received from [facility]    In the last 12 months, was there a time when you were not able to pay the mortgage or rent on time?: No    In the past 12 months, how many times have you  moved where you were living?: 0    At any time in the past 12 months, were you homeless or living in a shelter (including now)?: No  Utilities: Not At Risk (06/09/2023)   Received from [facility] Utilities    Threatened with loss of utilities: No  Health Literacy: Not on file    Past Medical History, Surgical history, Social history, and Family history were reviewed and updated as appropriate.   Disability  Please see review of systems for further details on the patient's review from today.   Objective:   Physical Exam:  There were no vitals taken for this visit.  Physical Exam Constitutional:      General: She is not in acute distress.    Appearance: She is well-developed.  Musculoskeletal:        General: No deformity.  Neurological:     Mental Status: She is alert and oriented to person, place, and time.     Motor: No tremor.     Coordination: Coordination normal.     Gait: Gait normal.  Psychiatric:        Attention and Perception: Attention normal. She is attentive.        Mood and Affect: Mood is depressed. Mood is not anxious. Affect is not labile, blunt, angry or inappropriate.        Speech: Speech normal.        Behavior: Behavior normal.        Thought Content: Thought content normal. Thought content is not delusional. Thought content does not include homicidal or suicidal ideation. Thought content does not include suicidal plan.        Cognition and Memory: Cognition normal.        Judgment: Judgment normal.     Comments: Insight is good. Some chronic stress due to her son's mental illness     Lab Review:     Component Value Date/Time   NA 143 12/06/2011 2023   K 3.8 12/06/2011 2023   CL 106 12/06/2011 2023   CO2 31 11/10/2008 0850   GLUCOSE 123 (H) 12/06/2011 2023   BUN 14 12/06/2011 2023   CREATININE 1.00 12/06/2011 2023   CALCIUM 9.0 11/10/2008 0850   GFRNONAA >60 11/10/2008 0850   GFRAA  11/10/2008 0850    >60        The eGFR has been  calculated using the MDRD equation. This calculation has not been validated in all clinical situations. eGFR's persistently <60 mL/min signify possible Chronic Kidney Disease.       Component Value Date/Time   WBC 10.4 12/06/2011 1950   RBC 4.61 12/06/2011 1950   HGB  13.6 12/06/2011 2023   HCT 40.0 12/06/2011 2023   PLT 291 12/06/2011 1950   MCV 85.7 12/06/2011 1950   MCH 29.5 12/06/2011 1950   MCHC 34.4 12/06/2011 1950   RDW 12.8 12/06/2011 1950   LYMPHSABS 2.2 12/06/2011 1950   MONOABS 0.5 12/06/2011 1950   EOSABS 0.2 12/06/2011 1950   BASOSABS 0.0 12/06/2011 1950    No results found for: POCLITH, LITHIUM   No results found for: PHENYTOIN, PHENOBARB, VALPROATE, CBMZ   .res Assessment: Plan:    Major depressive disorder, recurrent episode, moderate (HCC) - Plan: buPROPion  (WELLBUTRIN  XL) 150 MG 24 hr tablet, Dextromethorphan-buPROPion  ER (AUVELITY ) 45-105 MG TBCR  Attention deficit hyperactivity disorder (ADHD), combined type - Plan: buPROPion  (WELLBUTRIN  XL) 150 MG 24 hr tablet, lisdexamfetamine  (VYVANSE ) 70 MG capsule  Social anxiety disorder  Generalized anxiety disorder  Insomnia due to mental condition   30 min face to face time with patient. We. Was somewhat flat on higher dose fluoxetine  up to 80 mg daily..   She got benefit from Abilify  but stopped it due to gradual weight gain.  Auvelity  1 twice daily  markedly helpful.  Until lately.  May have lost benefit DT protein deficiency from Monjaro and wt losst.  So increase protein.   But increase protein and if not enough then add Wellbutrin  150 AM    She wants to continue 70 mg AM Discussed potential benefits, risks, and side effects of stimulants with patient to include increased heart rate, palpitations, insomnia, increased anxiety, increased irritability, or decreased appetite.  Instructed patient to contact office if experiencing any significant tolerability issues.  Sleep is better with the  quetiapine . 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  She has failed to be able to stop it. Seroquel  25  mg nightly. OK to continue zolpidem  or try gradual taper Disc SE and tachyphylaxis.  Disc withdrawal.   Patient is unlikely to experience antipsychotic side effects from Seroquel  at this low-dose.  FU 2 mos  Lorene Macintosh, MD, DFAPA   Please see After Visit Summary for patient specific instructions.  Future Appointments  Date Time Provider Department Center  03/23/2024 10:30 AM Cottle, Lorene KANDICE Raddle., MD [facility] None     No orders of the defined types were placed in this encounter.      -------------------------------

## 2024-03-23 ENCOUNTER — Ambulatory Visit: Admitting: Psychiatry

## 2024-04-13 ENCOUNTER — Ambulatory Visit (INDEPENDENT_AMBULATORY_CARE_PROVIDER_SITE_OTHER): Admitting: Psychiatry
